# Patient Record
Sex: Female | Born: 1990 | Race: White | Hispanic: No | Marital: Single | Smoking: Never smoker
Health system: Southern US, Community
[De-identification: ages and names within clinical notes are randomized; demographics above are authoritative.]

## PROBLEM LIST (undated history)

## (undated) DIAGNOSIS — J45909 Unspecified asthma, uncomplicated: Secondary | ICD-10-CM

## (undated) HISTORY — PX: FRACTURE SURGERY: SHX138

## (undated) HISTORY — PX: OTHER SURGICAL HISTORY: SHX169

---
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
## 2015-06-08 ENCOUNTER — Ambulatory Visit: Payer: Self-pay | Admitting: Family Medicine

## 2015-06-15 HISTORY — PX: HIP SURGERY: SHX245

## 2015-06-16 DIAGNOSIS — M25551 Pain in right hip: Secondary | ICD-10-CM | POA: Insufficient documentation

## 2015-08-21 ENCOUNTER — Ambulatory Visit (INDEPENDENT_AMBULATORY_CARE_PROVIDER_SITE_OTHER): Payer: BLUE CROSS/BLUE SHIELD | Admitting: Family Medicine

## 2015-08-21 ENCOUNTER — Encounter: Payer: Self-pay | Admitting: Family Medicine

## 2015-08-21 VITALS — BP 100/58 | HR 113 | Temp 98.3°F | Resp 20 | Ht 64.0 in | Wt 151.0 lb

## 2015-08-21 DIAGNOSIS — J040 Acute laryngitis: Secondary | ICD-10-CM | POA: Diagnosis not present

## 2015-08-21 DIAGNOSIS — J019 Acute sinusitis, unspecified: Secondary | ICD-10-CM | POA: Diagnosis not present

## 2015-08-21 MED ORDER — PROMETHAZINE-DM 6.25-15 MG/5ML PO SYRP: 5.0000 mL | ORAL_SOLUTION | Freq: Four times a day (QID) | ORAL | Status: DC | PRN

## 2015-08-21 MED ORDER — AZITHROMYCIN 250 MG PO TABS: ORAL_TABLET | ORAL | Status: DC

## 2015-08-21 NOTE — Progress Notes (Signed)
Patient ID: Lauren Weber, female   DOB: [birth year], 24 y.o.   MRN: [id number]        Patient: Lauren Weber Female    DOB: 01-17-1991   24 y.o.   MRN: [id number] Visit Date: 08/21/2015  Today's Provider: Dortha Kern, PA   Chief Complaint  Patient presents with  . URI   Subjective:    URI  This is a new problem. The current episode started in the past 7 days. The problem has been gradually worsening. There has been no fever. Associated symptoms include congestion, coughing, ear pain, headaches, rhinorrhea, sinus pain, a sore throat and wheezing. She has tried decongestant for the symptoms. The treatment provided no relief.  Patient reports she has been using her Albuterol inhaler 2 puffs once a day for the last 2 days.  Patient Active Problem List   Diagnosis Date Noted  . Arthralgia of hip 06/16/2015   Past Surgical History  Procedure Laterality Date  . Hip surgery Left 06/15/2015    [facility]    Family History  Problem Relation Age of Onset  . Healthy Mother   . Healthy Father   . Healthy Sister   . Healthy Sister   . Healthy Sister    Allergies  Allergen Reactions  . Amoxicillin Rash   Previous Medications   ALBUTEROL (PROAIR HFA) 108 (90 BASE) MCG/ACT INHALER    Inhale 2 puffs into the lungs as needed.    Review of Systems  Constitutional: Negative.   HENT: Positive for congestion, ear pain, rhinorrhea and sore throat.   Respiratory: Positive for cough and wheezing.   Neurological: Positive for headaches.    Social History  Substance Use Topics  . Smoking status: Never Smoker   . Smokeless tobacco: Never Used  . Alcohol Use: No   Objective:   BP 100/58 mmHg  Pulse 113  Temp(Src) 98.3 F (36.8 C)  Resp 20  Ht  (1.626 m)  Wt 151 lb (68.493 kg)  BMI 25.91 kg/m2  SpO2 98%  LMP 08/07/2015 (Approximate)  Physical Exam  Constitutional: She is oriented to person, place, and time. She appears well-developed and well-nourished.  HENT:    Head: Normocephalic.  Right Ear: External ear normal.  Left Ear: External ear normal.  Injected and reddened posterior pharynx without exudates.  Eyes: Conjunctivae and EOM are normal.  Neck: Neck supple.  Cardiovascular: Normal rate and regular rhythm.   Pulmonary/Chest: Effort normal and breath sounds normal.  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  Neurological: She is alert and oriented to person, place, and time.  Psychiatric: She has a normal mood and affect. Her behavior is normal.      Assessment & Plan:     1. Subacute sinusitis Onset with chills, body aches, sinus pressure headache and flare of asthma. May use Albuterol inhaler prn and add cough syrup with antibiotic. Recheck in 1 week if no better. - azithromycin (ZITHROMAX) 250 MG tablet; Take two tablets by mouth the first day then one daily for 4 days.  Dispense: 6 tablet; Refill: 0  2. Laryngitis Onset yesterday with post nasal drip. Gargle with saltwater and use cough syrup prn. Recheck prn. - promethazine-dextromethorphan (PROMETHAZINE-DM) 6.25-15 MG/5ML syrup; Take 5 mLs by mouth 4 (four) times daily as needed for cough.  Dispense: 118 mL; Refill: 0  3. Asthma History of asthma as a child. Rarely needs albuterol now. Has a nebulizer  at home for treatments prn. May use Albuterol MDI up to QID prn.      Dortha Kernennis Chrismon, PA  [facility]

## 2015-08-21 NOTE — Patient Instructions (Signed)
Laryngitis  Laryngitis is inflammation of your vocal cords. This causes hoarseness, coughing, loss of voice, sore throat, or a dry throat. Your vocal cords are two bands of muscles that are found in your throat. When you speak, these cords come together and vibrate. These vibrations come out through your mouth as sound. When your vocal cords are inflamed, your voice sounds different.  Laryngitis can be temporary (acute) or long-term (chronic). Most cases of acute laryngitis improve with time. Chronic laryngitis is laryngitis that lasts for more than three weeks.  CAUSES  Acute laryngitis may be caused by:   A viral infection.   Lots of talking, yelling, or singing. This is also called vocal strain.   Bacterial infections.  Chronic laryngitis may be caused by:   Vocal strain.   Injury to your vocal cords.   Acid reflux (gastroesophageal reflux disease or GERD).   Allergies.   Sinus infection.   Smoking.   Alcohol abuse.   Breathing in chemicals or dust.   Growths on the vocal cords.  RISK FACTORS  Risk factors for laryngitis include:   Smoking.   Alcohol abuse.   Having allergies.  SIGNS AND SYMPTOMS  Symptoms of laryngitis may include:   Low, hoarse voice.   Loss of voice.   Dry cough.   Sore throat.   Stuffy nose.  DIAGNOSIS  Laryngitis may be diagnosed by:   Physical exam.   Throat culture.   Blood test.   Laryngoscopy. This procedure allows your health care provider to look at your vocal cords with a mirror or viewing tube.  TREATMENT  Treatment for laryngitis depends on what is causing it. Usually, treatment involves resting your voice and using medicines to soothe your throat. However, if your laryngitis is caused by a bacterial infection, you may need to take antibiotic medicine. If your laryngitis is caused by a growth, you may need to have a procedure to remove it.  HOME CARE INSTRUCTIONS   Drink enough fluid to keep your urine clear or pale yellow.   Breathe in moist air. Use a  humidifier if you live in a dry climate.   Take medicines only as directed by your health care provider.   If you were prescribed an antibiotic medicine, finish it all even if you start to feel better.   Do not smoke cigarettes or electronic cigarettes. If you need help quitting, ask your health care provider.   Talk as little as possible. Also avoid whispering, which can cause vocal strain.   Write instead of talking. Do this until your voice is back to normal.  SEEK MEDICAL CARE IF:   You have a fever.   You have increasing pain.   You have difficulty swallowing.  SEEK IMMEDIATE MEDICAL CARE IF:   You cough up blood.   You have trouble breathing.     This information is not intended to replace advice given to you by your health care provider. Make sure you discuss any questions you have with your health care provider.     Document Released: 08/19/2005 Document Revised: 09/09/2014 Document Reviewed: 02/01/2014  Elsevier Interactive Patient Education 2016 Elsevier Inc[employer]

## 2015-09-14 ENCOUNTER — Ambulatory Visit
Admission: RE | Admit: 2015-09-14 | Discharge: 2015-09-14 | Disposition: A | Discharge status: Home / Self Care | Payer: BLUE CROSS/BLUE SHIELD | Source: Ambulatory Visit | Attending: Family Medicine | Admitting: Family Medicine

## 2015-09-14 ENCOUNTER — Ambulatory Visit (INDEPENDENT_AMBULATORY_CARE_PROVIDER_SITE_OTHER): Payer: BLUE CROSS/BLUE SHIELD | Admitting: Family Medicine

## 2015-09-14 ENCOUNTER — Encounter: Payer: Self-pay | Admitting: Family Medicine

## 2015-09-14 VITALS — BP 102/76 | HR 94 | Temp 98.0°F | Resp 18 | Wt 149.0 lb

## 2015-09-14 DIAGNOSIS — R6883 Chills (without fever): Secondary | ICD-10-CM

## 2015-09-14 DIAGNOSIS — Z8709 Personal history of other diseases of the respiratory system: Secondary | ICD-10-CM

## 2015-09-14 DIAGNOSIS — R059 Cough, unspecified: Secondary | ICD-10-CM

## 2015-09-14 DIAGNOSIS — R05 Cough: Secondary | ICD-10-CM | POA: Diagnosis not present

## 2015-09-14 DIAGNOSIS — H109 Unspecified conjunctivitis: Secondary | ICD-10-CM | POA: Diagnosis not present

## 2015-09-14 LAB — POCT URINE PREGNANCY: Preg Test, Ur: NEGATIVE

## 2015-09-14 MED ORDER — HYDROCODONE-HOMATROPINE 5-1.5 MG/5ML PO SYRP: 5.0000 mL | ORAL_SOLUTION | Freq: Three times a day (TID) | ORAL | Status: DC | PRN

## 2015-09-14 MED ORDER — DOXYCYCLINE HYCLATE 100 MG PO TABS: 100.0000 mg | ORAL_TABLET | Freq: Two times a day (BID) | ORAL | Status: DC

## 2015-09-14 MED ORDER — ERYTHROMYCIN 5 MG/GM OP OINT: 1.0000 "application " | TOPICAL_OINTMENT | Freq: Three times a day (TID) | OPHTHALMIC | Status: DC

## 2015-09-14 MED ORDER — ALBUTEROL SULFATE HFA 108 (90 BASE) MCG/ACT IN AERS
2.0000 | INHALATION_SPRAY | RESPIRATORY_TRACT | Status: DC | PRN
Start: 2015-09-14 — End: 2017-09-04

## 2015-09-14 NOTE — Progress Notes (Signed)
Patient ID: [name], female   DOB: 1990-11-01, 25 y.o.   MRN: [id number]   Patient: Lauren Weber Female    DOB: November 05, 1990   25 y.o.   MRN: [id number] Visit Date: 09/14/2015  Today's Provider: Dortha Kern, PA   Chief Complaint  Patient presents with  . URI   Subjective:    URI  This is a new problem. The current episode started in the past 7 days. The problem has been unchanged. Associated symptoms include congestion and coughing. Associated symptoms comments: Chills, sweats, body aches, fever, and fatigue. Treatments tried: flu and cold. The treatment provided no relief.  Was treated for sinusitis on 08-21-15 with Azithromycin and Promethazine-DM. Was better until a week ago when chills, cough, fatigue and temperature elevation to 99.3. LMP 3.5 weeks ago. Still using Albuterol prn asthma.  Patient Active Problem List   Diagnosis Date Noted  . Arthralgia of hip 06/16/2015   Past Surgical History  Procedure Laterality Date  . Hip surgery Left 06/15/2015    [facility]    Family History  Problem Relation Age of Onset  . Healthy Mother   . Healthy Father   . Healthy Sister   . Healthy Sister   . Healthy Sister    Allergies  Allergen Reactions  . Amoxicillin Rash    Previous Medications   ALBUTEROL (PROAIR HFA) 108 (90 BASE) MCG/ACT INHALER    Inhale 2 puffs into the lungs as needed.   NAPROXEN (NAPROSYN) 500 MG TABLET    Take 500 mg by mouth 2 (two) times daily with a meal.   PROMETHAZINE-DEXTROMETHORPHAN (PROMETHAZINE-DM) 6.25-15 MG/5ML SYRUP    Take 5 mLs by mouth 4 (four) times daily as needed for cough.    Review of Systems  Constitutional: Positive for fever, chills and fatigue.       Sweats   HENT: Positive for congestion.   Eyes: Negative.   Respiratory: Positive for cough.   Cardiovascular: Negative.   Gastrointestinal: Negative.   Endocrine: Negative.   Genitourinary: Negative.   Musculoskeletal: Negative.   Skin: Negative.     Allergic/Immunologic: Negative.   Neurological: Negative.   Hematological: Negative.   Psychiatric/Behavioral: Negative.     Social History  Substance Use Topics  . Smoking status: Never Smoker   . Smokeless tobacco: Never Used  . Alcohol Use: No   Objective:   BP 102/76 mmHg  Pulse 94  Temp(Src) 98 F (36.7 C) (Oral)  Resp 18  Wt 149 lb (67.586 kg)  SpO2 96%  LMP 07/12/2015  Physical Exam  Constitutional: She is oriented to person, place, and time. She appears well-developed and well-nourished.  HENT:  Head: Normocephalic.  Right Ear: External ear normal.  Left Ear: External ear normal.  Cobblestone appearance with redness right posterior pharynx.  Eyes: EOM are normal.  Bloody red right palpebral and bulbar conjunctivitis.   Neck: Normal range of motion. Neck supple.  Cardiovascular: Normal rate, regular rhythm and normal heart sounds.   Pulmonary/Chest: Effort normal and breath sounds normal.  Abdominal: Soft. Bowel sounds are normal.  Lymphadenopathy:    She has no cervical adenopathy.  Neurological: She is alert and oriented to person, place, and time.  Psychiatric: She has a normal mood and affect. Her behavior is normal.      Assessment & Plan:     1. Chills Onset with cough over the past week. Was treated for subacute sinusitis on 08-21-15 and got better until this week. Influenza test was  negative for A&B. Will check CBC to rule out anemia and judge degree of infection. May use Tylenol or Advil prn and encouraged to increase fluid intake. - CBC with Differential/Platelet  2. Cough Onset over the past week with fever. Urine pregnancy test negative. Will get CXR and given antibiotic with cough syrup. Recheck pending reports - DG Chest 2 View - HYDROcodone-homatropine (HYCODAN) 5-1.5 MG/5ML syrup; Take 5 mLs by mouth every 8 (eight) hours as needed for cough.  Dispense: 120 mL; Refill: 0 - doxycycline (VIBRA-TABS) 100 MG tablet; Take 1 tablet (100 mg total)  by mouth 2 (two) times daily.  Dispense: 20 tablet; Refill: 0  3. Hx of extrinsic asthma Albuterol helps to control wheezing and decrease cough a bit. Refilled ProAir-HFA. - albuterol (PROAIR HFA) 108 (90 Base) MCG/ACT inhaler; Inhale 2 puffs into the lungs as needed.  Dispense: 1 Inhaler; Refill: 3  4. Conjunctivitis of right eye Onset the past couple days with some matting of the right eye in the mornings. Treat with antibiotic ointment and recheck if no better in 3-4 days. - erythromycin ophthalmic ointment; Place 1 application into the right eye 3 (three) times daily. 1/2 inch ribbon of ointment inside of right lower eyelid  Dispense: 3.5 g; Refill: 0

## 2015-09-14 NOTE — Addendum Note (Signed)
Addended by: Sherre PootWALSTON, Fumio Vandam N on: 09/14/2015 01:24 PM   Modules accepted: Orders

## 2015-09-14 NOTE — Patient Instructions (Signed)

## 2015-09-15 ENCOUNTER — Telehealth: Payer: Self-pay | Admitting: Family Medicine

## 2015-09-15 ENCOUNTER — Telehealth: Payer: Self-pay

## 2015-09-15 LAB — CBC WITH DIFFERENTIAL/PLATELET
Basophils Absolute: 0 10*3/uL (ref 0.0–0.2)
Basos: 0 %
EOS (ABSOLUTE): 0 10*3/uL (ref 0.0–0.4)
Eos: 0 %
Hematocrit: 42.7 % (ref 34.0–46.6)
Hemoglobin: 14.4 g/dL (ref 11.1–15.9)
Immature Grans (Abs): 0 10*3/uL (ref 0.0–0.1)
Immature Granulocytes: 0 %
Lymphocytes Absolute: 2.2 10*3/uL (ref 0.7–3.1)
Lymphs: 24 %
MCH: 29.1 pg (ref 26.6–33.0)
MCHC: 33.7 g/dL (ref 31.5–35.7)
MCV: 86 fL (ref 79–97)
Monocytes Absolute: 1.2 10*3/uL — ABNORMAL HIGH (ref 0.1–0.9)
Monocytes: 13 %
Neutrophils Absolute: 5.6 10*3/uL (ref 1.4–7.0)
Neutrophils: 63 %
Platelets: 286 10*3/uL (ref 150–379)
RBC: 4.94 x10E6/uL (ref 3.77–5.28)
RDW: 12.1 % — ABNORMAL LOW (ref 12.3–15.4)
WBC: 9 10*3/uL (ref 3.4–10.8)

## 2015-09-15 NOTE — Telephone Encounter (Signed)
Patient advised as directed below. Patient verbalized understanding.  

## 2015-09-15 NOTE — Telephone Encounter (Signed)
-----   Message from Tamsen Roersennis E Chrismon, [place] sent at 09/15/2015  4:19 PM EST ----- Normal chest x-ray without signs of pneumonia. No sign of anemia or bacterial infection on blood counts. Slight viral shift on WBC differential. Continue present medications and be sure to drink plenty of fluids. May use Advil or Tylenol prn headache or fever. Recheck in a week if not better.

## 2015-09-15 NOTE — Telephone Encounter (Signed)
Pt's mom is inquiring about her chest xr.  [phone] or [phone]  Thanks, Barth Kirksteri

## 2015-09-15 NOTE — Telephone Encounter (Signed)
Patient advised of lab results

## 2015-09-15 NOTE — Telephone Encounter (Signed)
Pt is requesting results from labs.  QM#[phone]/MWCB#[phone]/MW

## 2016-01-01 DIAGNOSIS — Z8739 Personal history of other diseases of the musculoskeletal system and connective tissue: Secondary | ICD-10-CM | POA: Diagnosis not present

## 2016-01-01 DIAGNOSIS — Z09 Encounter for follow-up examination after completed treatment for conditions other than malignant neoplasm: Secondary | ICD-10-CM | POA: Diagnosis not present

## 2016-02-01 DIAGNOSIS — M25552 Pain in left hip: Secondary | ICD-10-CM | POA: Diagnosis not present

## 2016-02-12 DIAGNOSIS — M25552 Pain in left hip: Secondary | ICD-10-CM | POA: Diagnosis not present

## 2016-02-27 DIAGNOSIS — M25552 Pain in left hip: Secondary | ICD-10-CM | POA: Diagnosis not present

## 2016-03-26 DIAGNOSIS — M25552 Pain in left hip: Secondary | ICD-10-CM | POA: Diagnosis not present

## 2016-05-02 DIAGNOSIS — M25552 Pain in left hip: Secondary | ICD-10-CM | POA: Diagnosis not present

## 2016-05-02 DIAGNOSIS — M1612 Unilateral primary osteoarthritis, left hip: Secondary | ICD-10-CM | POA: Diagnosis not present

## 2016-05-14 DIAGNOSIS — M1612 Unilateral primary osteoarthritis, left hip: Secondary | ICD-10-CM | POA: Diagnosis not present

## 2016-05-17 DIAGNOSIS — M1612 Unilateral primary osteoarthritis, left hip: Secondary | ICD-10-CM | POA: Diagnosis not present

## 2016-05-20 DIAGNOSIS — M1612 Unilateral primary osteoarthritis, left hip: Secondary | ICD-10-CM | POA: Diagnosis not present

## 2016-05-23 DIAGNOSIS — M1612 Unilateral primary osteoarthritis, left hip: Secondary | ICD-10-CM | POA: Diagnosis not present

## 2016-05-28 DIAGNOSIS — M1612 Unilateral primary osteoarthritis, left hip: Secondary | ICD-10-CM | POA: Diagnosis not present

## 2016-05-28 DIAGNOSIS — Z01818 Encounter for other preprocedural examination: Secondary | ICD-10-CM | POA: Diagnosis not present

## 2016-06-05 DIAGNOSIS — M1612 Unilateral primary osteoarthritis, left hip: Secondary | ICD-10-CM | POA: Diagnosis not present

## 2016-06-07 DIAGNOSIS — M1612 Unilateral primary osteoarthritis, left hip: Secondary | ICD-10-CM | POA: Diagnosis not present

## 2016-06-10 DIAGNOSIS — M1612 Unilateral primary osteoarthritis, left hip: Secondary | ICD-10-CM | POA: Diagnosis not present

## 2016-06-13 DIAGNOSIS — M1612 Unilateral primary osteoarthritis, left hip: Secondary | ICD-10-CM | POA: Diagnosis not present

## 2016-06-17 DIAGNOSIS — Z7951 Long term (current) use of inhaled steroids: Secondary | ICD-10-CM | POA: Diagnosis not present

## 2016-06-17 DIAGNOSIS — Z79891 Long term (current) use of opiate analgesic: Secondary | ICD-10-CM | POA: Diagnosis not present

## 2016-06-17 DIAGNOSIS — Z4789 Encounter for other orthopedic aftercare: Secondary | ICD-10-CM | POA: Diagnosis not present

## 2016-06-17 DIAGNOSIS — Z7982 Long term (current) use of aspirin: Secondary | ICD-10-CM | POA: Diagnosis not present

## 2016-06-17 DIAGNOSIS — G8918 Other acute postprocedural pain: Secondary | ICD-10-CM | POA: Diagnosis not present

## 2016-06-17 DIAGNOSIS — M1612 Unilateral primary osteoarthritis, left hip: Secondary | ICD-10-CM | POA: Diagnosis not present

## 2016-06-17 DIAGNOSIS — J45909 Unspecified asthma, uncomplicated: Secondary | ICD-10-CM | POA: Diagnosis not present

## 2016-06-17 DIAGNOSIS — M25552 Pain in left hip: Secondary | ICD-10-CM | POA: Diagnosis not present

## 2016-06-17 DIAGNOSIS — R112 Nausea with vomiting, unspecified: Secondary | ICD-10-CM | POA: Diagnosis not present

## 2016-06-17 DIAGNOSIS — M1990 Unspecified osteoarthritis, unspecified site: Secondary | ICD-10-CM | POA: Insufficient documentation

## 2016-06-17 DIAGNOSIS — Z79899 Other long term (current) drug therapy: Secondary | ICD-10-CM | POA: Diagnosis not present

## 2016-06-17 DIAGNOSIS — Z881 Allergy status to other antibiotic agents status: Secondary | ICD-10-CM | POA: Diagnosis not present

## 2016-06-21 DIAGNOSIS — Z96642 Presence of left artificial hip joint: Secondary | ICD-10-CM | POA: Diagnosis not present

## 2016-06-21 DIAGNOSIS — M25552 Pain in left hip: Secondary | ICD-10-CM | POA: Diagnosis not present

## 2016-06-23 DIAGNOSIS — G8918 Other acute postprocedural pain: Secondary | ICD-10-CM | POA: Diagnosis not present

## 2016-06-23 DIAGNOSIS — Z96642 Presence of left artificial hip joint: Secondary | ICD-10-CM | POA: Diagnosis not present

## 2016-06-23 DIAGNOSIS — M25552 Pain in left hip: Secondary | ICD-10-CM | POA: Diagnosis not present

## 2016-06-25 DIAGNOSIS — Z96642 Presence of left artificial hip joint: Secondary | ICD-10-CM | POA: Diagnosis not present

## 2016-06-25 DIAGNOSIS — M25552 Pain in left hip: Secondary | ICD-10-CM | POA: Diagnosis not present

## 2016-06-27 DIAGNOSIS — M25552 Pain in left hip: Secondary | ICD-10-CM | POA: Diagnosis not present

## 2016-06-27 DIAGNOSIS — Z96642 Presence of left artificial hip joint: Secondary | ICD-10-CM | POA: Diagnosis not present

## 2016-07-02 DIAGNOSIS — Z96642 Presence of left artificial hip joint: Secondary | ICD-10-CM | POA: Diagnosis not present

## 2016-07-02 DIAGNOSIS — M25552 Pain in left hip: Secondary | ICD-10-CM | POA: Diagnosis not present

## 2016-07-04 DIAGNOSIS — M25552 Pain in left hip: Secondary | ICD-10-CM | POA: Diagnosis not present

## 2016-07-04 DIAGNOSIS — Z96642 Presence of left artificial hip joint: Secondary | ICD-10-CM | POA: Diagnosis not present

## 2016-07-09 DIAGNOSIS — M25552 Pain in left hip: Secondary | ICD-10-CM | POA: Diagnosis not present

## 2016-07-09 DIAGNOSIS — Z96642 Presence of left artificial hip joint: Secondary | ICD-10-CM | POA: Diagnosis not present

## 2016-07-11 DIAGNOSIS — Z96642 Presence of left artificial hip joint: Secondary | ICD-10-CM | POA: Diagnosis not present

## 2016-07-11 DIAGNOSIS — M25552 Pain in left hip: Secondary | ICD-10-CM | POA: Diagnosis not present

## 2016-07-16 DIAGNOSIS — M25552 Pain in left hip: Secondary | ICD-10-CM | POA: Diagnosis not present

## 2016-07-16 DIAGNOSIS — Z96642 Presence of left artificial hip joint: Secondary | ICD-10-CM | POA: Diagnosis not present

## 2016-07-18 DIAGNOSIS — Z96642 Presence of left artificial hip joint: Secondary | ICD-10-CM | POA: Diagnosis not present

## 2016-07-18 DIAGNOSIS — M25552 Pain in left hip: Secondary | ICD-10-CM | POA: Diagnosis not present

## 2016-07-22 DIAGNOSIS — Z96642 Presence of left artificial hip joint: Secondary | ICD-10-CM | POA: Diagnosis not present

## 2016-07-22 DIAGNOSIS — M25552 Pain in left hip: Secondary | ICD-10-CM | POA: Diagnosis not present

## 2016-07-24 DIAGNOSIS — Z96642 Presence of left artificial hip joint: Secondary | ICD-10-CM | POA: Diagnosis not present

## 2016-07-24 DIAGNOSIS — M25552 Pain in left hip: Secondary | ICD-10-CM | POA: Diagnosis not present

## 2016-07-30 DIAGNOSIS — M25552 Pain in left hip: Secondary | ICD-10-CM | POA: Diagnosis not present

## 2016-07-30 DIAGNOSIS — Z96642 Presence of left artificial hip joint: Secondary | ICD-10-CM | POA: Diagnosis not present

## 2016-08-01 DIAGNOSIS — M25552 Pain in left hip: Secondary | ICD-10-CM | POA: Diagnosis not present

## 2016-08-01 DIAGNOSIS — Z96642 Presence of left artificial hip joint: Secondary | ICD-10-CM | POA: Diagnosis not present

## 2016-08-08 DIAGNOSIS — Z471 Aftercare following joint replacement surgery: Secondary | ICD-10-CM | POA: Diagnosis not present

## 2016-08-08 DIAGNOSIS — Z96642 Presence of left artificial hip joint: Secondary | ICD-10-CM | POA: Diagnosis not present

## 2016-08-13 DIAGNOSIS — M25552 Pain in left hip: Secondary | ICD-10-CM | POA: Diagnosis not present

## 2016-08-13 DIAGNOSIS — Z96642 Presence of left artificial hip joint: Secondary | ICD-10-CM | POA: Diagnosis not present

## 2016-08-15 DIAGNOSIS — Z96642 Presence of left artificial hip joint: Secondary | ICD-10-CM | POA: Diagnosis not present

## 2016-08-15 DIAGNOSIS — M25552 Pain in left hip: Secondary | ICD-10-CM | POA: Diagnosis not present

## 2016-08-20 DIAGNOSIS — Z96642 Presence of left artificial hip joint: Secondary | ICD-10-CM | POA: Diagnosis not present

## 2016-08-20 DIAGNOSIS — M25552 Pain in left hip: Secondary | ICD-10-CM | POA: Diagnosis not present

## 2016-08-22 DIAGNOSIS — M25552 Pain in left hip: Secondary | ICD-10-CM | POA: Diagnosis not present

## 2016-08-22 DIAGNOSIS — Z96642 Presence of left artificial hip joint: Secondary | ICD-10-CM | POA: Diagnosis not present

## 2016-10-02 DIAGNOSIS — Z96642 Presence of left artificial hip joint: Secondary | ICD-10-CM | POA: Diagnosis not present

## 2016-10-02 DIAGNOSIS — Z471 Aftercare following joint replacement surgery: Secondary | ICD-10-CM | POA: Diagnosis not present

## 2016-12-23 DIAGNOSIS — H52223 Regular astigmatism, bilateral: Secondary | ICD-10-CM | POA: Diagnosis not present

## 2017-09-04 ENCOUNTER — Other Ambulatory Visit: Payer: Self-pay | Admitting: Family Medicine

## 2017-09-04 DIAGNOSIS — Z8709 Personal history of other diseases of the respiratory system: Secondary | ICD-10-CM

## 2017-09-04 MED ORDER — DM-GUAIFENESIN ER 30-600 MG PO TB12: 1.0000 | ORAL_TABLET | Freq: Two times a day (BID) | ORAL | 0 refills | Status: DC

## 2017-09-04 MED ORDER — ALBUTEROL SULFATE HFA 108 (90 BASE) MCG/ACT IN AERS: 2.0000 | INHALATION_SPRAY | RESPIRATORY_TRACT | 3 refills | Status: DC | PRN

## 2017-09-04 NOTE — Progress Notes (Signed)
Some flare of asthma with persistent dry cough over the past week. Requests refill of albuterol inhaler and Tessalon Perles for cough. [place]in [place] in the next 6-7 days. Advised she [place]

## 2017-09-29 DIAGNOSIS — J069 Acute upper respiratory infection, unspecified: Secondary | ICD-10-CM | POA: Diagnosis not present

## 2017-11-10 ENCOUNTER — Encounter: Payer: Self-pay | Admitting: Family Medicine

## 2017-11-10 ENCOUNTER — Telehealth: Payer: Self-pay | Admitting: Family Medicine

## 2017-11-10 ENCOUNTER — Ambulatory Visit (INDEPENDENT_AMBULATORY_CARE_PROVIDER_SITE_OTHER): Payer: BLUE CROSS/BLUE SHIELD | Admitting: Family Medicine

## 2017-11-10 VITALS — BP 92/58 | HR 72 | Temp 98.3°F | Resp 16 | Ht 61.0 in | Wt 151.0 lb

## 2017-11-10 DIAGNOSIS — Z0000 Encounter for general adult medical examination without abnormal findings: Secondary | ICD-10-CM | POA: Diagnosis not present

## 2017-11-10 DIAGNOSIS — Z124 Encounter for screening for malignant neoplasm of cervix: Secondary | ICD-10-CM | POA: Diagnosis not present

## 2017-11-10 DIAGNOSIS — Z114 Encounter for screening for human immunodeficiency virus [HIV]: Secondary | ICD-10-CM

## 2017-11-10 DIAGNOSIS — R5381 Other malaise: Secondary | ICD-10-CM

## 2017-11-10 DIAGNOSIS — R5383 Other fatigue: Secondary | ICD-10-CM

## 2017-11-10 LAB — POCT URINALYSIS DIPSTICK
Bilirubin, UA: NEGATIVE
Blood, UA: NEGATIVE
Glucose, UA: NEGATIVE
Ketones, UA: NEGATIVE
Leukocytes, UA: NEGATIVE
Nitrite, UA: NEGATIVE
Protein, UA: NEGATIVE
Spec Grav, UA: 1.01 (ref 1.010–1.025)
Urobilinogen, UA: 0.2 E.U./dL
pH, UA: 7 (ref 5.0–8.0)

## 2017-11-10 NOTE — Telephone Encounter (Signed)
[facility] [address]. Check with pharmacy to see if refill still available. Note written.

## 2017-11-10 NOTE — Telephone Encounter (Signed)
Called pharmacy [facility] [address]) and they are no longer in business. Per voicemail, all customers are [facility] to [facility]. Called the patient's grandmother to verify new pharmacy, and she would like a hard copy instead so the patient's mother could have it filled.   Also, she was wanting to know if the patient's note could say that she will be able to return to back to work by Thursday (3/14)? She reports that the patient felt really weak and tired after her blood draw this morning and being that her BP is so low, she feels that the patient will be back to herself by Thursday. She reports that if she feels better before then she will go back to work.

## 2017-11-10 NOTE — Telephone Encounter (Signed)
Patient needs note saying she was seen today for "low blood" and that she is not feeling well.  She also wants a pres. For Albuteral.  Will pick up this afternoon around 4:00 pm.

## 2017-11-10 NOTE — Progress Notes (Signed)
Patient: Lauren Weber, Female    DOB: 04/05/1991, 27 y.o.   MRN: [id number] Visit Date: 11/10/2017  Today's Provider: Dortha Kernennis Chrismon, PA   Chief Complaint  Patient presents with  . Annual Exam  . low BP   Subjective:    Annual physical exam Lauren Weber is a 27 y.o. female who presents today for health maintenance and complete physical. She feels well. She reports exercising daily. She reports she is sleeping fairly well.   Patient also mentions that she has had low BP recently. She has been dieting doing a keto diet for the last month. Patient reports that she has increased her salt intake, and reports that she has not improved. She also has occasional headaches that lasts at least 1 hour. She takes Excedrin.   Review of Systems  Constitutional: Negative.   HENT: Negative.   Eyes: Negative.   Respiratory: Negative.   Cardiovascular: Negative for chest pain, palpitations and leg swelling.  Gastrointestinal: Negative.   Endocrine: Negative.   Genitourinary: Negative.   Musculoskeletal: Positive for arthralgias.  Skin: Negative.   Allergic/Immunologic: Negative.   Neurological: Positive for dizziness and headaches.  Hematological: Negative.   Psychiatric/Behavioral: Positive for decreased concentration.    Social History      She  reports that  has never smoked. she has never used smokeless tobacco. She reports that she does not drink alcohol or use drugs.       Social History   Socioeconomic History  . Marital status: Single    Spouse name: None  . Number of children: None  . Years of education: None  . Highest education level: None  Social Needs  . Financial resource strain: None  . Food insecurity - worry: None  . Food insecurity - inability: None  . Transportation needs - medical: None  . Transportation needs - non-medical: None  Occupational History  . None  Tobacco Use  . Smoking status: Never Smoker  . Smokeless tobacco: Never Used    Substance and Sexual Activity  . Alcohol use: No  . Drug use: No  . Sexual activity: None  Other Topics Concern  . None  Social History Narrative  . None    No past medical history on file.   Patient Active Problem List   Diagnosis Date Noted  . Arthralgia of hip 06/16/2015    Past Surgical History:  Procedure Laterality Date  . [facility]     Family History        Family Status  Relation Name Status  . Mother  Alive  . Father  Alive  . Sister  Alive  . Sister  Alive  . Sister  Alive        Her family history includes Healthy in her father, mother, sister, sister, and sister.     Allergies  Allergen Reactions  . Amoxicillin Rash    Current Outpatient Medications:  .  albuterol (PROAIR HFA) 108 (90 Base) MCG/ACT inhaler, Inhale 2 puffs into the lungs as needed., Disp: 1 Inhaler, Rfl: 3 .  dextromethorphan-guaiFENesin (MUCINEX DM) 30-600 MG 12hr tablet, Take 1 tablet by mouth 2 (two) times daily. (Patient not taking: Reported on 11/10/2017), Disp: 20 tablet, Rfl: 0 .  doxycycline (VIBRA-TABS) 100 MG tablet, Take 1 tablet (100 mg total) by mouth 2 (two) times daily. (Patient not taking: Reported on 11/10/2017), Disp: 20 tablet, Rfl: 0 .  erythromycin ophthalmic ointment, Place  1 application into the right eye 3 (three) times daily. 1/2 inch ribbon of ointment inside of right lower eyelid (Patient not taking: Reported on 11/10/2017), Disp: 3.5 g, Rfl: 0 .  HYDROcodone-homatropine (HYCODAN) 5-1.5 MG/5ML syrup, Take 5 mLs by mouth every 8 (eight) hours as needed for cough. (Patient not taking: Reported on 11/10/2017), Disp: 120 mL, Rfl: 0 .  naproxen (NAPROSYN) 500 MG tablet, Take 500 mg by mouth 2 (two) times daily with a meal., Disp: , Rfl:  .  promethazine-dextromethorphan (PROMETHAZINE-DM) 6.25-15 MG/5ML syrup, Take 5 mLs by mouth 4 (four) times daily as needed for cough. (Patient not taking: Reported on 11/10/2017), Disp: 118 mL, Rfl: 0    Patient Care Team: Chrismon, Jodell Cipro, PA as PCP - General (Family Medicine)      Objective:   Vitals: BP (!) 92/58 (BP Location: Right Arm, Patient Position: Sitting, Cuff Size: Normal)   Pulse 72   Temp 98.3 F (36.8 C)   Resp 16   Ht 5\' 1"  (1.549 m)   Wt 151 lb (68.5 kg) Comment: per patient  LMP 10/20/2017   BMI 28.53 kg/m    Vitals:   11/10/17 0905  BP: (!) 92/58  Pulse: 72  Resp: 16  Temp: 98.3 F (36.8 C)  Weight: 151 lb (68.5 kg)  Height: 5\' 1"  (1.549 m)     Physical Exam  Constitutional: She is oriented to person, place, and time. She appears well-developed and well-nourished.  HENT:  Head: Normocephalic and atraumatic.  Right Ear: External ear normal.  Left Ear: External ear normal.  Nose: Nose normal.  Mouth/Throat: Oropharynx is clear and moist.  Eyes: Conjunctivae and EOM are normal. Pupils are equal, round, and reactive to light. Right eye exhibits no discharge.  Neck: Normal range of motion. Neck supple. No tracheal deviation present. No thyromegaly present.  Cardiovascular: Normal rate, regular rhythm, normal heart sounds and intact distal pulses.  No murmur heard. Pulmonary/Chest: Effort normal and breath sounds normal. No respiratory distress. She has no wheezes. She has no rales. She exhibits no tenderness.  Abdominal: Soft. She exhibits no distension and no mass. There is no tenderness. There is no rebound and no guarding.  Genitourinary: Vagina normal. Rectal exam shows guaiac negative stool. No vaginal discharge found.  Genitourinary Comments: Small uterus - no history of sexual activity.  Musculoskeletal: Normal range of motion. She exhibits no edema or tenderness.  Lymphadenopathy:    She has no cervical adenopathy.  Neurological: She is alert and oriented to person, place, and time. She has normal reflexes. No cranial nerve deficit. She exhibits normal muscle tone. Coordination normal.  Skin: Skin is warm and dry. No rash noted. No  erythema.  Psychiatric: She has a normal mood and affect. Her behavior is normal. Judgment and thought content normal.    Depression Screen PHQ 2/9 Scores 11/10/2017  PHQ - 2 Score 2  PHQ- 9 Score 5    Assessment & Plan:     Routine Health Maintenance and Physical Exam  Exercise Activities and Dietary recommendations Goals    Recommend more balanced diet with increase in water intake and exercise 3-4 days a week for 30 minutes each time.      Health Maintenance  Topic Date Due  . HIV Screening  01/06/2008  . TETANUS/TDAP  01/06/2012  . PAP SMEAR  01/05/2014  . INFLUENZA VACCINE  12/01/2017 (Originally 04/02/2017)    Discussed health benefits of physical activity, and encouraged her to engage in regular exercise  appropriate for her age and condition.    1. Annual physical exam General health normal. Hemoccult slide negative during DRE. Unable to give tetanus booster today due to vaccine not available. Check routine labs and exercise 3-4 days a week with increase in fluid intake. - Pap IG, CT/NG NAA, and HPV (high risk) - CBC with Differential/Platelet - Comprehensive metabolic panel - TSH - Lipid panel  2. Cervical cancer screening LMP 10-20-17 without significant or prolonged menses. Some dysmenorrhea symptoms with cramps. Normal pelvic and breast exam. Not sexually active and difficult to get PAP. - Pap IG, CT/NG NAA, and HPV (high risk)  3. Malaise and fatigue Generalized fatigue, headache and malaise intermittently the past month after trying a Ketogenic Diet. Has stopped the diet but not a lot better yet. Increase fluid intake and get back on balanced diet with regular exercise. Check labs for signs of anemia, dehydration or other metabolic disorders. - CBC with Differential/Platelet - Comprehensive metabolic panel - TSH - POCT urinalysis dipstick  4. Screening for HIV (human immunodeficiency virus) - HIV antibody   Dortha Kern, PA  [facility]

## 2017-11-10 NOTE — Telephone Encounter (Signed)
Done

## 2017-11-11 LAB — CBC WITH DIFFERENTIAL/PLATELET
Basophils Absolute: 0.1 10*3/uL (ref 0.0–0.2)
Basos: 1 %
EOS (ABSOLUTE): 0.4 10*3/uL (ref 0.0–0.4)
Eos: 5 %
Hematocrit: 41.2 % (ref 34.0–46.6)
Hemoglobin: 13.3 g/dL (ref 11.1–15.9)
Immature Grans (Abs): 0 10*3/uL (ref 0.0–0.1)
Immature Granulocytes: 0 %
Lymphocytes Absolute: 2.9 10*3/uL (ref 0.7–3.1)
Lymphs: 35 %
MCH: 29.6 pg (ref 26.6–33.0)
MCHC: 32.3 g/dL (ref 31.5–35.7)
MCV: 92 fL (ref 79–97)
Monocytes Absolute: 0.6 10*3/uL (ref 0.1–0.9)
Monocytes: 7 %
Neutrophils Absolute: 4.3 10*3/uL (ref 1.4–7.0)
Neutrophils: 52 %
Platelets: 323 10*3/uL (ref 150–379)
RBC: 4.5 x10E6/uL (ref 3.77–5.28)
RDW: 12.8 % (ref 12.3–15.4)
WBC: 8.2 10*3/uL (ref 3.4–10.8)

## 2017-11-11 LAB — LIPID PANEL
Chol/HDL Ratio: 2.3 ratio (ref 0.0–4.4)
Cholesterol, Total: 177 mg/dL (ref 100–199)
HDL: 76 mg/dL (ref >39)
LDL Calculated: 92 mg/dL (ref 0–99)
Triglycerides: 43 mg/dL (ref 0–149)
VLDL Cholesterol Cal: 9 mg/dL (ref 5–40)

## 2017-11-11 LAB — COMPREHENSIVE METABOLIC PANEL
ALT: 17 IU/L (ref 0–32)
AST: 16 IU/L (ref 0–40)
Albumin/Globulin Ratio: 2 (ref 1.2–2.2)
Albumin: 4.4 g/dL (ref 3.5–5.5)
Alkaline Phosphatase: 63 IU/L (ref 39–117)
BUN/Creatinine Ratio: 19 (ref 9–23)
BUN: 14 mg/dL (ref 6–20)
Bilirubin Total: 0.4 mg/dL (ref 0.0–1.2)
CO2: 23 mmol/L (ref 20–29)
Calcium: 9 mg/dL (ref 8.7–10.2)
Chloride: 103 mmol/L (ref 96–106)
Creatinine, Ser: 0.72 mg/dL (ref 0.57–1.00)
GFR calc Af Amer: 136 mL/min/{1.73_m2} (ref >59)
GFR calc non Af Amer: 118 mL/min/{1.73_m2} (ref >59)
Globulin, Total: 2.2 g/dL (ref 1.5–4.5)
Glucose: 80 mg/dL (ref 65–99)
Potassium: 4.7 mmol/L (ref 3.5–5.2)
Sodium: 139 mmol/L (ref 134–144)
Total Protein: 6.6 g/dL (ref 6.0–8.5)

## 2017-11-11 LAB — HIV ANTIBODY (ROUTINE TESTING W REFLEX): HIV Screen 4th Generation wRfx: NONREACTIVE

## 2017-11-11 LAB — TSH: TSH: 2.66 u[IU]/mL (ref 0.450–4.500)

## 2017-11-13 LAB — PAP IG, CT-NG NAA, HPV HIGH-RISK
Chlamydia, Nuc. Acid Amp: NEGATIVE
Gonococcus by Nucleic Acid Amp: NEGATIVE
HPV, high-risk: NEGATIVE
PAP Smear Comment: 0

## 2017-11-14 DIAGNOSIS — R002 Palpitations: Secondary | ICD-10-CM | POA: Diagnosis not present

## 2017-11-14 DIAGNOSIS — R42 Dizziness and giddiness: Secondary | ICD-10-CM | POA: Diagnosis not present

## 2017-11-14 DIAGNOSIS — Z3202 Encounter for pregnancy test, result negative: Secondary | ICD-10-CM | POA: Diagnosis not present

## 2017-11-14 DIAGNOSIS — R5383 Other fatigue: Secondary | ICD-10-CM | POA: Diagnosis not present

## 2017-11-14 DIAGNOSIS — R51 Headache: Secondary | ICD-10-CM | POA: Diagnosis not present

## 2017-11-17 DIAGNOSIS — R079 Chest pain, unspecified: Secondary | ICD-10-CM | POA: Diagnosis not present

## 2017-11-17 DIAGNOSIS — G4452 New daily persistent headache (NDPH): Secondary | ICD-10-CM | POA: Diagnosis not present

## 2017-11-17 DIAGNOSIS — R42 Dizziness and giddiness: Secondary | ICD-10-CM | POA: Diagnosis not present

## 2017-11-17 DIAGNOSIS — J45909 Unspecified asthma, uncomplicated: Secondary | ICD-10-CM | POA: Diagnosis not present

## 2017-11-17 DIAGNOSIS — R51 Headache: Secondary | ICD-10-CM | POA: Diagnosis not present

## 2017-11-17 DIAGNOSIS — Z96642 Presence of left artificial hip joint: Secondary | ICD-10-CM | POA: Diagnosis not present

## 2017-12-01 DIAGNOSIS — R0609 Other forms of dyspnea: Secondary | ICD-10-CM | POA: Diagnosis not present

## 2017-12-01 DIAGNOSIS — R002 Palpitations: Secondary | ICD-10-CM | POA: Diagnosis not present

## 2017-12-01 DIAGNOSIS — R0789 Other chest pain: Secondary | ICD-10-CM | POA: Diagnosis not present

## 2017-12-01 DIAGNOSIS — R42 Dizziness and giddiness: Secondary | ICD-10-CM | POA: Diagnosis not present

## 2017-12-16 DIAGNOSIS — R002 Palpitations: Secondary | ICD-10-CM | POA: Diagnosis not present

## 2018-01-01 DIAGNOSIS — R002 Palpitations: Secondary | ICD-10-CM | POA: Diagnosis not present

## 2018-01-01 DIAGNOSIS — R42 Dizziness and giddiness: Secondary | ICD-10-CM | POA: Diagnosis not present

## 2018-04-08 ENCOUNTER — Ambulatory Visit (INDEPENDENT_AMBULATORY_CARE_PROVIDER_SITE_OTHER): Payer: BLUE CROSS/BLUE SHIELD | Admitting: Family Medicine

## 2018-04-08 ENCOUNTER — Encounter: Payer: Self-pay | Admitting: Family Medicine

## 2018-04-08 VITALS — BP 111/77 | HR 79 | Temp 98.7°F | Resp 16 | Wt 154.0 lb

## 2018-04-08 DIAGNOSIS — F988 Other specified behavioral and emotional disorders with onset usually occurring in childhood and adolescence: Secondary | ICD-10-CM | POA: Diagnosis not present

## 2018-04-08 DIAGNOSIS — F419 Anxiety disorder, unspecified: Secondary | ICD-10-CM | POA: Diagnosis not present

## 2018-04-08 MED ORDER — AMPHETAMINE-DEXTROAMPHETAMINE 5 MG PO TABS: 5.0000 mg | ORAL_TABLET | Freq: Two times a day (BID) | ORAL | 0 refills | Status: DC

## 2018-04-08 NOTE — Progress Notes (Signed)
Patient: Lauren Weber L Merkel Female    DOB: 09/04/1990   27 y.o.   MRN: [id number] Visit Date: 04/08/2018  Today's Provider: Mila Merryonald Fisher, MD   Chief Complaint  Patient presents with  . Anxiety   Subjective:    Anxiety  Presents for follow-up visit. Symptoms include decreased concentration, depressed mood, excessive worry, insomnia, irritability, nervous/anxious behavior, panic and restlessness. Patient reports no chest pain, compulsions, confusion, dizziness, dry mouth, feeling of choking, hyperventilation, impotence, malaise, muscle tension, nausea, obsessions, palpitations, shortness of breath or suicidal ideas. Symptoms occur occasionally (at least 2 days a week). Duration: mintues to hours. The severity of symptoms is moderate. The quality of sleep is good. Nighttime awakenings: one to two.     Patient is here to discuss Anxiety and trouble with attention and focus. Patient states she has episodes at least 2 days a week. Anxiety mostly seems to happen at work. Patient states during these episodes she will have symptoms of decreased concentration, depressed mood, excessive worry, nervousness, panic, and irritability. She feels like her anxiety is provoked by frustration with focusing and attention at work. She has been followed by Dr. Omelia BlackwaterHeaden in the past and reports she was prescribed Vyvanse at one time in high school which she didn't like taking because it made her feel weird. 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 She states she doesn't have any trouble with anxiety on her days off.    Allergies  Allergen Reactions  . Amoxicillin Rash     Current Outpatient Medications:  .  albuterol (PROAIR HFA) 108 (90 Base) MCG/ACT inhaler, Inhale 2 puffs into the lungs as needed., Disp: 1 Inhaler, Rfl: 3 .  naproxen (NAPROSYN) 500 MG tablet, Take 500 mg by mouth 2 (two) times daily with a meal., Disp: , Rfl:   Review of  Systems  Constitutional: Positive for irritability. Negative for appetite change, chills, fatigue and fever.  Respiratory: Negative for chest tightness and shortness of breath.   Cardiovascular: Negative for chest pain and palpitations.  Gastrointestinal: Negative for abdominal pain, nausea and vomiting.  Genitourinary: Negative for impotence.  Neurological: Negative for dizziness and weakness.  Psychiatric/Behavioral: Positive for agitation, decreased concentration and sleep disturbance. Negative for confusion and suicidal ideas. The patient is nervous/anxious and has insomnia.     Social History   Tobacco Use  . Smoking status: Never Smoker  . Smokeless tobacco: Never Used  Substance Use Topics  . Alcohol use: No   Objective:   BP 111/77 (BP Location: Left Arm, Patient Position: Sitting, Cuff Size: Normal)   Pulse 79   Temp 98.7 F (37.1 C) (Oral)   Resp 16   Wt 154 lb (69.9 kg)   SpO2 98%   BMI 29.10 kg/m     Physical Exam  General appearance: alert, well developed, well nourished, cooperative and in no distress Head: Normocephalic, without obvious abnormality, atraumatic Respiratory: Respirations even and unlabored, normal respiratory rate Extremities: No gross deformities Skin: Skin color, texture, turgor normal. No rashes seen  Psych: Appropriate mood and affect. Neurologic: Mental status: Alert, oriented to person, place, and time, thought content appropriate.     Assessment & Plan:     1. Anxiety Likely secondary to frustration with ADD with her new job.   2. Attention deficit disorder (ADD) without hyperactivity Previously treated by Dr. Omelia BlackwaterHeaden who no longer accepts her insurance, but did not do well  with Vyvanse prescribed in the past. Will try- amphetamine-dextroamphetamine (ADDERALL) 5 MG tablet; Take 1 tablet (5 mg total) by mouth 2 (two) times daily with a meal.  Dispense: 60 tablet; Refill: 0  Advised to start with just one in the morning, and may  increase to BID if she feels it is wearing off before end of work day.   Follow up in a month.        Mila Merry, MD  [facility]

## 2018-04-28 NOTE — Progress Notes (Signed)
Patient: Lauren Weber Female    DOB: [birth year]   27 y.o.   MRN: [id number] Visit Date: 04/29/2018  Today's Provider: Mila Merry, MD   Chief Complaint  Patient presents with  . Shoulder Pain    x 1 week   Subjective:    Shoulder Pain   The pain is present in the left shoulder. Episode onset: 1 week ago. There has been a history of trauma (injured shoulder about 4 years ago). The problem occurs intermittently. The problem has been gradually worsening. Associated symptoms include numbness (in left arm) and tingling. Pertinent negatives include no fever or stiffness. Exacerbated by: movement of shoulder joint.  She has been taking 2 ibuprofen every four hours with minimal relief. 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   Follow up ADD Was prescribed low dose of Adderall on 04-08-2018 and reports that has been very effective. She usually takes one at the beginning of her work shift, but doesn't take the second dose very often. Is well tolerated and not having any side effects that she is aware of. Able to focus and concentrated on her job much better and not getting distracted as easily.      Allergies  Allergen Reactions  . Amoxicillin Rash     Current Outpatient Medications:  .  albuterol (PROAIR HFA) 108 (90 Base) MCG/ACT inhaler, Inhale 2 puffs into the lungs as needed., Disp: 1 Inhaler, Rfl: 3 .  amphetamine-dextroamphetamine (ADDERALL) 5 MG tablet, Take 1 tablet (5 mg total) by mouth 2 (two) times daily with a meal., Disp: 60 tablet, Rfl: 0 .  naproxen (NAPROSYN) 500 MG tablet, Take 500 mg by mouth 2 (two) times daily with a meal., Disp: , Rfl:   Review of Systems  Constitutional: Negative for appetite change, chills, fatigue and fever.  Respiratory: Negative for chest tightness and shortness of breath.   Cardiovascular: Negative for chest pain and palpitations.  Gastrointestinal: Negative for  abdominal pain, nausea and vomiting.  Musculoskeletal: Positive for arthralgias (left shoulder). Negative for stiffness.  Neurological: Positive for tingling and numbness (in left arm). Negative for dizziness and weakness.    Social History   Tobacco Use  . Smoking status: Never Smoker  . Smokeless tobacco: Never Used  Substance Use Topics  . Alcohol use: No   Objective:   BP 100/65 (BP Location: Left Arm, Patient Position: Sitting, Cuff Size: Normal)   Pulse 75   Temp 98.5 F (36.9 C) (Oral)   Wt 156 lb (70.8 kg)   SpO2 99% Comment: room air  BMI 29.48 kg/m  Vitals:   04/29/18 0811  BP: 100/65  Pulse: 75  Temp: 98.5 F (36.9 C)  TempSrc: Oral  SpO2: 99%  Weight: 156 lb (70.8 kg)     Physical Exam  General appearance: alert, well developed, well nourished, cooperative and in no distress Head: Normocephalic, without obvious abnormality, atraumatic Respiratory: Respirations even and unlabored, normal respiratory rate Extremities: Tender over left anterior glenohumeral joint with mild swelling. FROM, pain at limits of external rotation.      Assessment & Plan:     1. Acute pain of left shoulder Progressive over last 10 days on ibuprofen. start- predniSONE (DELTASONE) 20 MG tablet; Take 1 tablet (20 mg total) by mouth 2 (two) times daily with a meal.  Dispense: 20 tablet; Refill: 0.  Call for orthopedic referral if not greatly improved next  week.   2. ADD Doing well since initiation of low dose Adderall. Continue current medications.        Mila Merryonald Fisher, MD  [facility]

## 2018-04-29 ENCOUNTER — Ambulatory Visit (INDEPENDENT_AMBULATORY_CARE_PROVIDER_SITE_OTHER): Payer: BLUE CROSS/BLUE SHIELD | Admitting: Family Medicine

## 2018-04-29 ENCOUNTER — Encounter: Payer: Self-pay | Admitting: Family Medicine

## 2018-04-29 VITALS — BP 100/65 | HR 75 | Temp 98.5°F | Wt 156.0 lb

## 2018-04-29 DIAGNOSIS — M25512 Pain in left shoulder: Secondary | ICD-10-CM

## 2018-04-29 DIAGNOSIS — F988 Other specified behavioral and emotional disorders with onset usually occurring in childhood and adolescence: Secondary | ICD-10-CM | POA: Diagnosis not present

## 2018-04-29 MED ORDER — PREDNISONE 20 MG PO TABS: 20.0000 mg | ORAL_TABLET | Freq: Two times a day (BID) | ORAL | 0 refills | Status: DC

## 2018-05-11 ENCOUNTER — Ambulatory Visit: Payer: Self-pay | Admitting: Family Medicine

## 2018-05-22 ENCOUNTER — Ambulatory Visit (INDEPENDENT_AMBULATORY_CARE_PROVIDER_SITE_OTHER): Payer: BLUE CROSS/BLUE SHIELD | Admitting: Family Medicine

## 2018-05-22 ENCOUNTER — Encounter: Payer: Self-pay | Admitting: Family Medicine

## 2018-05-22 VITALS — BP 98/68 | HR 85 | Temp 98.6°F | Wt 152.0 lb

## 2018-05-22 DIAGNOSIS — F988 Other specified behavioral and emotional disorders with onset usually occurring in childhood and adolescence: Secondary | ICD-10-CM | POA: Diagnosis not present

## 2018-05-22 MED ORDER — AMPHETAMINE-DEXTROAMPHETAMINE 5 MG PO TABS: 5.0000 mg | ORAL_TABLET | Freq: Two times a day (BID) | ORAL | 0 refills | Status: DC

## 2018-05-22 NOTE — Progress Notes (Signed)
Patient: Lauren Weber Female    DOB: 07/07/1991   27 y.o.   MRN: [id number] Visit Date: 05/22/2018  Today's Provider: Dortha Kern, PA   Chief Complaint  Patient presents with  . ADHD    One month follow up    Subjective:    HPI  Pt comes in today for a follow-up of ADHD.  She reports starting Adderall 5mg  in the morning and occasionally in the PM for work.      No past medical history on file. Patient Active Problem List   Diagnosis Date Noted  . 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 Arthralgia of hip 06/16/2015   Past Surgical History:  Procedure Laterality Date  . HIP SURGERY Left 06/15/2015   [facility]    Family History  Problem Relation Age of Onset  . Healthy Mother   . Healthy Father   . Healthy Sister   . Healthy Sister   . Healthy Sister    Allergies  Allergen Reactions  . Amoxicillin Rash    Current Outpatient Medications:  .  albuterol (PROAIR HFA) 108 (90 Base) MCG/ACT inhaler, Inhale 2 puffs into the lungs as needed., Disp: 1 Inhaler, Rfl: 3 .  amphetamine-dextroamphetamine (ADDERALL) 5 MG tablet, Take 1 tablet (5 mg total) by mouth 2 (two) times daily with a meal., Disp: 60 tablet, Rfl: 0 .  naproxen (NAPROSYN) 500 MG tablet, Take 500 mg by mouth 2 (two) times daily with a meal., Disp: , Rfl:  .  predniSONE (DELTASONE) 20 MG tablet, Take 1 tablet (20 mg total) by mouth 2 (two) times daily with a meal. (Patient not taking: Reported on 05/22/2018), Disp: 20 tablet, Rfl: 0  Review of Systems  Constitutional: Negative.   Gastrointestinal: Negative.   Psychiatric/Behavioral: Negative for agitation, behavioral problems, confusion, decreased concentration, dysphoric mood, hallucinations, self-injury, sleep disturbance and suicidal ideas. The patient is not nervous/anxious and is not hyperactive.    Social History   Tobacco Use  . Smoking status: Never Smoker  . Smokeless tobacco: Never Used  Substance Use  Topics  . Alcohol use: No   Objective:   BP 98/68 (BP Location: Right Arm, Patient Position: Sitting, Cuff Size: Normal)   Pulse 85   Temp 98.6 F (37 C) (Oral)   Wt 152 lb (68.9 kg)   LMP 05/01/2018   SpO2 97%   BMI 28.72 kg/m   Wt Readings from Last 3 Encounters:  05/22/18 152 lb (68.9 kg)  04/29/18 156 lb (70.8 kg)  04/08/18 154 lb (69.9 kg)    Vitals:   05/22/18 1438  BP: 98/68  Pulse: 85  Temp: 98.6 F (37 C)  TempSrc: Oral  SpO2: 97%  Weight: 152 lb (68.9 kg)   Physical Exam  Constitutional: She is oriented to person, place, and time. She appears well-developed and well-nourished. No distress.  HENT:  Head: Normocephalic and atraumatic.  Right Ear: Hearing normal.  Left Ear: Hearing normal.  Nose: Nose normal.  Eyes: Conjunctivae and lids are normal. Right eye exhibits no discharge. Left eye exhibits no discharge. No scleral icterus.  Cardiovascular: Normal rate and regular rhythm.  Pulmonary/Chest: Effort normal and breath sounds normal. No respiratory distress.  Musculoskeletal: Normal range of motion.  Neurological: She is alert and oriented to person, place, and time.  Skin: Skin is intact. No lesion and no rash noted.  Psychiatric: She has a normal mood and affect. Her speech is normal and behavior is  normal. Thought content normal.      Assessment & Plan:     1. Attention deficit disorder (ADD) without hyperactivity Able to focus and concentrate better. Anxiety symptoms calmer. No significant weight loss, palpitations, chest pains, tremor, appetite loss or sleep disturbance. Feels the Adderall has help a great deal. Uses one tablet on a regular basis daily. Occasionally will add the second tablet. Will refill medications and recheck in 3 months. - amphetamine-dextroamphetamine (ADDERALL) 5 MG tablet; Take 1 tablet (5 mg total) by mouth 2 (two) times daily with a meal.  Dispense: 60 tablet; Refill: 0       Dortha Kernennis Chrismon, PA  [facility]

## 2018-06-19 DIAGNOSIS — F3289 Other specified depressive episodes: Secondary | ICD-10-CM | POA: Diagnosis not present

## 2018-06-19 DIAGNOSIS — F4310 Post-traumatic stress disorder, unspecified: Secondary | ICD-10-CM | POA: Diagnosis not present

## 2018-06-23 ENCOUNTER — Encounter: Payer: Self-pay | Admitting: Family Medicine

## 2018-06-23 ENCOUNTER — Ambulatory Visit (INDEPENDENT_AMBULATORY_CARE_PROVIDER_SITE_OTHER): Payer: BLUE CROSS/BLUE SHIELD | Admitting: Family Medicine

## 2018-06-23 VITALS — BP 98/60 | HR 70 | Temp 97.6°F | Resp 16 | Wt 157.0 lb

## 2018-06-23 DIAGNOSIS — Z30017 Encounter for initial prescription of implantable subdermal contraceptive: Secondary | ICD-10-CM

## 2018-06-23 DIAGNOSIS — F4310 Post-traumatic stress disorder, unspecified: Secondary | ICD-10-CM | POA: Diagnosis not present

## 2018-06-23 DIAGNOSIS — F3289 Other specified depressive episodes: Secondary | ICD-10-CM | POA: Diagnosis not present

## 2018-06-23 LAB — POCT URINE PREGNANCY: Preg Test, Ur: NEGATIVE

## 2018-06-23 MED ORDER — ETONOGESTREL 68 MG ~~LOC~~ IMPL
68.0000 mg | DRUG_IMPLANT | Freq: Once | SUBCUTANEOUS | Status: AC
  Administered 2018-06-23: 68 mg via SUBCUTANEOUS

## 2018-06-23 NOTE — Progress Notes (Signed)
Name: Lauren Weber   MRN: [id number]    DOB: [birth year]   Date:06/23/2018       Progress Note  Subjective  Chief Complaint  Chief Complaint  Patient presents with  . Pre-op Exam    HPI  Patient present today to discuss contraceptive options.  She has never taken contraception before, except for a sample of OCPs.  She found it difficult to remember to take the pills daily.  She has friends that have Nexplanons and she likes the idea of the ease of LARC.  She has irregular menses that are initially heavy and then lighter.  She is not sexually active and has no h/o STDs   History reviewed. No pertinent past medical history.  Social History   Tobacco Use  . Smoking status: Never Smoker  . Smokeless tobacco: Never Used  Substance Use Topics  . Alcohol use: No     Current Outpatient Medications:  .  albuterol (PROAIR HFA) 108 (90 Base) MCG/ACT inhaler, Inhale 2 puffs into the lungs as needed., Disp: 1 Inhaler, Rfl: 3 .  amphetamine-dextroamphetamine (ADDERALL) 5 MG tablet, Take 1 tablet (5 mg total) by mouth 2 (two) times daily with a meal., Disp: 60 tablet, Rfl: 0 .  naproxen (NAPROSYN) 500 MG tablet, Take 500 mg by mouth 2 (two) times daily with a meal., Disp: , Rfl:  .  predniSONE (DELTASONE) 20 MG tablet, Take 1 tablet (20 mg total) by mouth 2 (two) times daily with a meal. (Patient not taking: Reported on 05/22/2018), Disp: 20 tablet, Rfl: 0  Current Facility-Administered Medications:  .  etonogestrel (NEXPLANON) implant 68 mg, 68 mg, Subdermal, Once, Krizia Flight, Marzella Schlein, MD  Allergies  Allergen Reactions  . Amoxicillin Rash    Review of Systems  Constitutional: Negative.   Respiratory: Negative.   Cardiovascular: Negative.   Genitourinary: Negative.   Neurological: Negative.   Psychiatric/Behavioral: Negative.     Objective  Vitals:   06/23/18 0815  BP: 98/60  Pulse: 70  Resp: 16  Temp: 97.6 F (36.4 C)  TempSrc: Oral  SpO2: 99%  Weight: 157 lb (71.2  kg)     Physical Exam  Constitutional: She is oriented to person, place, and time. She appears well-developed and well-nourished. No distress.  HENT:  Head: Normocephalic and atraumatic.  Eyes: Conjunctivae are normal. No scleral icterus.  Cardiovascular: Normal rate and regular rhythm.  Pulmonary/Chest: Effort normal. No respiratory distress.  Musculoskeletal: She exhibits no edema.  Neurological: She is alert and oriented to person, place, and time.  Skin: Skin is warm and dry. Capillary refill takes less than 2 seconds. No rash noted.  Psychiatric: She has a normal mood and affect. Her behavior is normal.  Vitals reviewed.    Recent Results (from the past 2160 hour(s))  POCT urine pregnancy     Status: None   Collection Time: 06/23/18  8:27 AM  Result Value Ref Range   Preg Test, Ur Negative Negative     Assessment & Plan 27 y.o. F G0 with h/o ADD presents for contraception and decides on Nexplanon placement  1. Nexplanon insertion - discussed contraceptive options and pros and cons of each - patient decides to proceed with Nexplanon insertion - Pregnancy test negative - discussed risks and benefits of proedure and procedure performed as below with no complications - POCT urine pregnancy - etonogestrel (NEXPLANON) implant 68 mg   PROCEDURE NOTE: NEXPLANON INSERTION Patient given informed consent and signed copy in the chart. An appropriate time  out was been taken Pregnancy test negative Left  arm area prepped and draped in the usual sterile fashion. 4 cc of 1% lidocaine without epinephrine used for local anesthesia.   Nexplanon was inserted in typical fashion in the  Left ARM. There were no complications.  Pressure bandage was  applied to decrease bruising. Patient given follow up instructions should she experience redness, swelling at sight or fever in the next 24 hours. Patient given Nexplanon pocket card.   Return if symptoms worsen or fail to improve.   The  entirety of the information documented in the History of Present Illness, Review of Systems and Physical Exam were personally obtained by me. Portions of this information were initially documented by Hetty Ely, CMA and reviewed by me for thoroughness and accuracy.    Erasmo Downer, MD, MPH [facility] 06/23/2018 8:34 AM

## 2018-06-23 NOTE — Patient Instructions (Signed)
Nexplanon Instructions After Insertion  Keep bandage clean and dry for 24 hours  May use ice/Tylenol/Ibuprofen for soreness or pain  If you develop fever, drainage or increased warmth from incision site-contact office immediately   

## 2018-07-07 DIAGNOSIS — F3289 Other specified depressive episodes: Secondary | ICD-10-CM | POA: Diagnosis not present

## 2018-07-07 DIAGNOSIS — F4310 Post-traumatic stress disorder, unspecified: Secondary | ICD-10-CM | POA: Diagnosis not present

## 2018-07-09 DIAGNOSIS — Z880 Allergy status to penicillin: Secondary | ICD-10-CM | POA: Diagnosis not present

## 2018-07-09 DIAGNOSIS — N951 Menopausal and female climacteric states: Secondary | ICD-10-CM | POA: Diagnosis not present

## 2018-07-09 DIAGNOSIS — M25551 Pain in right hip: Secondary | ICD-10-CM | POA: Diagnosis not present

## 2018-07-21 DIAGNOSIS — S73191A Other sprain of right hip, initial encounter: Secondary | ICD-10-CM | POA: Diagnosis not present

## 2018-07-21 DIAGNOSIS — M67851 Other specified disorders of synovium, right hip: Secondary | ICD-10-CM | POA: Diagnosis not present

## 2018-07-25 DIAGNOSIS — G8929 Other chronic pain: Secondary | ICD-10-CM | POA: Diagnosis not present

## 2018-07-25 DIAGNOSIS — S73191A Other sprain of right hip, initial encounter: Secondary | ICD-10-CM | POA: Diagnosis not present

## 2018-07-25 DIAGNOSIS — M25551 Pain in right hip: Secondary | ICD-10-CM | POA: Diagnosis not present

## 2018-08-03 DIAGNOSIS — M25551 Pain in right hip: Secondary | ICD-10-CM | POA: Diagnosis not present

## 2018-08-13 ENCOUNTER — Ambulatory Visit (INDEPENDENT_AMBULATORY_CARE_PROVIDER_SITE_OTHER): Payer: BLUE CROSS/BLUE SHIELD | Admitting: Family Medicine

## 2018-08-13 ENCOUNTER — Encounter: Payer: Self-pay | Admitting: Family Medicine

## 2018-08-13 VITALS — BP 104/64 | HR 79 | Temp 98.4°F | Resp 16 | Wt 161.0 lb

## 2018-08-13 DIAGNOSIS — Z01818 Encounter for other preprocedural examination: Secondary | ICD-10-CM | POA: Diagnosis not present

## 2018-08-13 DIAGNOSIS — M25551 Pain in right hip: Secondary | ICD-10-CM | POA: Diagnosis not present

## 2018-08-13 DIAGNOSIS — Z96642 Presence of left artificial hip joint: Secondary | ICD-10-CM

## 2018-08-13 NOTE — Progress Notes (Signed)
Patient: Lauren Weber Female    DOB: 1990/12/07   27 y.o.   MRN: [id number] Visit Date: 08/13/2018  Today's Provider: Dortha Kern, PA   Chief Complaint  Patient presents with  . Pre-op Exam   Subjective:     HPI  Patient is having hip orthoscopy on 09/17/2017. Patient is here for medical clearance.   History reviewed. No pertinent past medical history. Patient Active Problem List   Diagnosis Date Noted  . 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 Arthralgia of hip 06/16/2015   Past Surgical History:  Procedure Laterality Date  . HIP SURGERY Left 06/15/2015   [facility] [place]    Family History  Problem Relation Age of Onset  . Healthy Mother   . Healthy Father   . Healthy Sister   . Healthy Sister   . Healthy Sister    Allergies  Allergen Reactions  . Amoxicillin Rash    Current Outpatient Medications:  .  albuterol (PROAIR HFA) 108 (90 Base) MCG/ACT inhaler, Inhale 2 puffs into the lungs as needed., Disp: 1 Inhaler, Rfl: 3 .  amphetamine-dextroamphetamine (ADDERALL) 5 MG tablet, Take 1 tablet (5 mg total) by mouth 2 (two) times daily with a meal., Disp: 60 tablet, Rfl: 0 .  naproxen (NAPROSYN) 500 MG tablet, Take 500 mg by mouth 2 (two) times daily with a meal., Disp: , Rfl:  .  predniSONE (DELTASONE) 20 MG tablet, Take 1 tablet (20 mg total) by mouth 2 (two) times daily with a meal. (Patient not taking: Reported on 08/13/2018), Disp: 20 tablet, Rfl: 0  Review of Systems  Constitutional: Negative for appetite change, chills, fatigue and fever.  Respiratory: Negative for chest tightness and shortness of breath.   Cardiovascular: Negative for chest pain and palpitations.  Gastrointestinal: Negative for abdominal pain, nausea and vomiting.  Neurological: Negative for dizziness and weakness.   Social History   Tobacco Use  . Smoking status: Never Smoker  . Smokeless tobacco: Never Used  Substance Use Topics  . Alcohol use:  No   Objective:   BP 104/64 (BP Location: Right Arm, Patient Position: Sitting, Cuff Size: Normal)   Pulse 79   Temp 98.4 F (36.9 C) (Oral)   Resp 16   Wt 161 lb (73 kg)   SpO2 99%   BMI 30.42 kg/m  Vitals:   08/13/18 1023  BP: 104/64  Pulse: 79  Resp: 16  Temp: 98.4 F (36.9 C)  TempSrc: Oral  SpO2: 99%  Weight: 161 lb (73 kg)   Physical Exam Constitutional:      General: She is not in acute distress.    Appearance: Normal appearance. She is well-developed.  HENT:     Head: Normocephalic and atraumatic.     Right Ear: Hearing, tympanic membrane and ear canal normal.     Left Ear: Hearing and tympanic membrane normal.     Nose: Nose normal.     Mouth/Throat:     Mouth: Mucous membranes are moist.     Pharynx: Oropharynx is clear.  Eyes:     General: Lids are normal. No scleral icterus.       Right eye: No discharge.        Left eye: No discharge.     Extraocular Movements: Extraocular movements intact.     Conjunctiva/sclera: Conjunctivae normal.  Neck:     Musculoskeletal: Neck supple.     Vascular: No carotid bruit.  Cardiovascular:  Rate and Rhythm: Normal rate and regular rhythm.     Pulses: Normal pulses.     Heart sounds: Normal heart sounds.  Pulmonary:     Effort: Pulmonary effort is normal. No respiratory distress.     Breath sounds: Normal breath sounds.  Abdominal:     General: Bowel sounds are normal.  Musculoskeletal: Normal range of motion.  Skin:    Findings: No lesion or rash.  Neurological:     Mental Status: She is alert and oriented to person, place, and time.  Psychiatric:        Speech: Speech normal.        Behavior: Behavior normal.        Thought Content: Thought content normal.       Assessment & Plan:    1. Pre-op exam Medically stable and at low risk for orthopedic surgery. Rare flare of asthma with good control with infrequent use of Albuterol Inhaler. Given note for medical clearance for surgery planned for 09-17-18  by Dr. 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  2. Right hip pain Onset over the past 4-6 weeks with labral tear and joint impingement noted on MRI of the right hip. Scheduled for arthroscopic repair 09-17-18. Has a history of total left hip replacement 06-17-16 with history of traumatic left hip dislocation in 2006 - s/p 5 hip surgeries (pins placed, pins removed, hardware plates/screws placed and arthroscopy).  3. Hx of total hip arthroplasty, left As stated above in 2017. No pain and good ROM on the left.     Dortha Kernennis Chrismon, PA  [facility]

## 2018-08-27 ENCOUNTER — Ambulatory Visit (INDEPENDENT_AMBULATORY_CARE_PROVIDER_SITE_OTHER): Payer: BLUE CROSS/BLUE SHIELD | Admitting: Family Medicine

## 2018-08-27 ENCOUNTER — Encounter: Payer: Self-pay | Admitting: Family Medicine

## 2018-08-27 VITALS — BP 106/72 | HR 80 | Temp 98.5°F | Resp 16 | Wt 161.0 lb

## 2018-08-27 DIAGNOSIS — J069 Acute upper respiratory infection, unspecified: Secondary | ICD-10-CM | POA: Diagnosis not present

## 2018-08-27 DIAGNOSIS — J45909 Unspecified asthma, uncomplicated: Secondary | ICD-10-CM

## 2018-08-27 DIAGNOSIS — R05 Cough: Secondary | ICD-10-CM

## 2018-08-27 DIAGNOSIS — R059 Cough, unspecified: Secondary | ICD-10-CM

## 2018-08-27 MED ORDER — AZITHROMYCIN 250 MG PO TABS: ORAL_TABLET | ORAL | 0 refills | Status: AC

## 2018-08-27 MED ORDER — AZITHROMYCIN 250 MG PO TABS: ORAL_TABLET | ORAL | 0 refills | Status: DC

## 2018-08-27 MED ORDER — PREDNISONE 10 MG PO TABS: ORAL_TABLET | ORAL | 0 refills | Status: DC

## 2018-08-27 NOTE — Progress Notes (Signed)
Patient: Lauren Weber Female    DOB: 03/06/1991   27 y.o.   MRN: [id number] Visit Date: 08/27/2018  Today's Provider: Mila Merryonald Kalifa Cadden, MD   Chief Complaint  Patient presents with  . URI    Started about four days ago.  . Sinusitis   Subjective:     URI   This is a new problem. The current episode started in the past 7 days. The problem has been gradually worsening. There has been no fever. Associated symptoms include congestion, coughing, rhinorrhea, sinus pain, sneezing, a sore throat and wheezing. Pertinent negatives include no abdominal pain, chest pain, diarrhea, ear pain, headaches, nausea, neck pain, plugged ear sensation, swollen glands or vomiting.  Sinusitis  This is a new problem. The current episode started in the past 7 days. The problem has been gradually worsening since onset. Associated symptoms include congestion, coughing, shortness of breath, sinus pressure, sneezing and a sore throat. Pertinent negatives include no chills, diaphoresis, ear pain, headaches, neck pain or swollen glands. The treatment provided no relief.  she does report history of asthma and has been using her albuterol inhaler a few times a day this week, last was just before her OV this morning. Did have fever 3-4 days ago, but none in the last 3 days.   Allergies  Allergen Reactions  . Amoxicillin Rash     Current Outpatient Medications:  .  albuterol (PROAIR HFA) 108 (90 Base) MCG/ACT inhaler, Inhale 2 puffs into the lungs as needed., Disp: 1 Inhaler, Rfl: 3 .  amphetamine-dextroamphetamine (ADDERALL) 5 MG tablet, Take 1 tablet (5 mg total) by mouth 2 (two) times daily with a meal., Disp: 60 tablet, Rfl: 0 .  naproxen (NAPROSYN) 500 MG tablet, Take 500 mg by mouth 2 (two) times daily with a meal., Disp: , Rfl:    Review of Systems  Constitutional: Positive for fatigue. Negative for activity change, appetite change, chills, diaphoresis, fever and unexpected weight change.  HENT:  Positive for congestion, postnasal drip, rhinorrhea, sinus pressure, sinus pain, sneezing and sore throat. Negative for ear pain, tinnitus and trouble swallowing.   Eyes: Positive for discharge. Negative for photophobia, pain, redness, itching and visual disturbance.  Respiratory: Positive for cough, chest tightness, shortness of breath and wheezing. Negative for apnea.   Cardiovascular: Negative for chest pain.  Gastrointestinal: Negative.  Negative for abdominal pain, diarrhea, nausea and vomiting.  Musculoskeletal: Negative for neck pain and neck stiffness.  Neurological: Negative for dizziness, light-headedness and headaches.    Social History   Tobacco Use  . Smoking status: Never Smoker  . Smokeless tobacco: Never Used  Substance Use Topics  . Alcohol use: No      Objective:   BP 106/72 (BP Location: Right Arm, Patient Position: Sitting, Cuff Size: Normal)   Pulse 80   Temp 98.5 F (36.9 C) (Oral)   Resp 16   Wt 161 lb (73 kg)   LMP 08/24/2018   BMI 30.42 kg/m  Vitals:   08/27/18 0851  BP: 106/72  Pulse: 80  Resp: 16  Temp: 98.5 F (36.9 C)  TempSrc: Oral  Weight: 161 lb (73 kg)     Physical Exam  General Appearance:    Alert, cooperative, no distress  HENT:   bilateral TM normal without fluid or infection, sinuses nontender and nasal mucosa congested  Eyes:    PERRL, conjunctiva/corneas clear, EOM's intact       Lungs:     Clear to auscultation  bilaterally, respirations unlabored  Heart:    Regular rate and rhythm  Neurologic:   Awake, alert, oriented x 3. No apparent focal neurological           defect.           Assessment & Plan    1. Cough  - predniSONE (DELTASONE) 10 MG tablet; 6 tablets for 1 day, then 5 for 1 day, then 4 for 1 day, then 3 for 1 day, then 2 for 1 day then 1 for 1 day.  Dispense: 21 tablet; Refill: 0  2. Upper respiratory tract infection, unspecified type  - azithromycin (ZITHROMAX) 250 MG tablet; 2 by mouth today, then 1 daily  for 4 days  Dispense: 6 tablet; Refill: 0  3. Uncomplicated asthma, unspecified asthma severity, unspecified whether persistent  - predniSONE (DELTASONE) 10 MG tablet; 6 tablets for 1 day, then 5 for 1 day, then 4 for 1 day, then 3 for 1 day, then 2 for 1 day then 1 for 1 day.  Dispense: 21 tablet; Refill: 0  Call if symptoms change or if not rapidly improving.      Mila Merryonald Taylan Mayhan, MD  [facility]

## 2018-08-27 NOTE — Patient Instructions (Signed)
.   Please bring all of your medications to every appointment so we can make sure that our medication list is the same as yours.   

## 2018-09-11 ENCOUNTER — Telehealth: Payer: Self-pay

## 2018-09-11 NOTE — Telephone Encounter (Signed)
This should be authorized by surgeon after surgery to be sure it would not adversely interact with any medications he may prescribe at that point.

## 2018-09-11 NOTE — Telephone Encounter (Signed)
Please Review

## 2018-09-11 NOTE — Telephone Encounter (Signed)
Patient reports that she will be having surgery next week and would like Dennis to call in the Trans Derm patches to help with the nausea she has after anesthesia. [facility] [place]. Please advise. Thanks!

## 2018-09-14 ENCOUNTER — Telehealth: Payer: Self-pay | Admitting: Family Medicine

## 2018-09-14 NOTE — Telephone Encounter (Signed)
Patient is returning a call to Josie °

## 2018-09-14 NOTE — Telephone Encounter (Signed)
Patient advised as directed below. 

## 2018-09-14 NOTE — Telephone Encounter (Signed)
LMTCB

## 2018-09-14 NOTE — Telephone Encounter (Signed)
Please see other telephone note

## 2018-09-16 ENCOUNTER — Telehealth: Payer: Self-pay | Admitting: Family Medicine

## 2018-09-16 DIAGNOSIS — R112 Nausea with vomiting, unspecified: Secondary | ICD-10-CM

## 2018-09-16 DIAGNOSIS — M25551 Pain in right hip: Secondary | ICD-10-CM | POA: Diagnosis not present

## 2018-09-16 DIAGNOSIS — Z9889 Other specified postprocedural states: Principal | ICD-10-CM

## 2018-09-16 MED ORDER — SCOPOLAMINE 1 MG/3DAYS TD PT72: 1.0000 | MEDICATED_PATCH | TRANSDERMAL | 0 refills | Status: DC

## 2018-09-16 NOTE — Telephone Encounter (Signed)
Patient advised.

## 2018-09-16 NOTE — Telephone Encounter (Signed)
Hello Maurine Minister is not in today and patient is having her surgery tomorrow.Patient had called and requested this on 09/11/2018 per Maurine Minister should be authorized by surgeon and patient was advised on 01/13 and now she is calling if this can be send in for her.   Please advise

## 2018-09-16 NOTE — Telephone Encounter (Signed)
Sent in scopolamine patch for her

## 2018-09-16 NOTE — Telephone Encounter (Signed)
Pt calling to let Maurine Minister know: Pt's surgery is at 9 am on Thursday morning. Surgeon Has Ordered: Xarelto  Doxycycline Pepcid Naproxen Norco Valium  Needing Dennis to prescribe Laveda Norman Scope Patch  for tomorrow's 9 am surgery  Please advise.  Thanks, [employer]

## 2018-09-17 DIAGNOSIS — S73191A Other sprain of right hip, initial encounter: Secondary | ICD-10-CM | POA: Diagnosis not present

## 2018-09-17 DIAGNOSIS — M94251 Chondromalacia, right hip: Secondary | ICD-10-CM | POA: Diagnosis not present

## 2018-09-17 DIAGNOSIS — M25551 Pain in right hip: Secondary | ICD-10-CM | POA: Diagnosis not present

## 2018-09-17 DIAGNOSIS — M25851 Other specified joint disorders, right hip: Secondary | ICD-10-CM | POA: Diagnosis not present

## 2018-09-17 DIAGNOSIS — M65851 Other synovitis and tenosynovitis, right thigh: Secondary | ICD-10-CM | POA: Diagnosis not present

## 2018-09-21 DIAGNOSIS — M25851 Other specified joint disorders, right hip: Secondary | ICD-10-CM | POA: Diagnosis not present

## 2018-09-21 DIAGNOSIS — M25551 Pain in right hip: Secondary | ICD-10-CM | POA: Diagnosis not present

## 2018-09-23 DIAGNOSIS — M25851 Other specified joint disorders, right hip: Secondary | ICD-10-CM | POA: Diagnosis not present

## 2018-09-23 DIAGNOSIS — M25551 Pain in right hip: Secondary | ICD-10-CM | POA: Diagnosis not present

## 2018-09-24 ENCOUNTER — Telehealth: Payer: Self-pay | Admitting: Family Medicine

## 2018-09-24 NOTE — Telephone Encounter (Signed)
Pt called back checking on the patches for nausea.  Please advise.  Thanks, [employer]

## 2018-09-24 NOTE — Telephone Encounter (Signed)
Pt calling to let Maurine Minister know she is still getting nauseated  from pain medication.  Asking if more patches can be called in for her to help?  Please advise.  Thanks, [employer]

## 2018-09-24 NOTE — Telephone Encounter (Signed)
She should be making the surgeon aware of this side effect issue. They may need to change pain medications.

## 2018-09-25 ENCOUNTER — Telehealth: Payer: Self-pay | Admitting: Family Medicine

## 2018-09-25 ENCOUNTER — Other Ambulatory Visit: Payer: Self-pay | Admitting: Family Medicine

## 2018-09-25 DIAGNOSIS — R112 Nausea with vomiting, unspecified: Secondary | ICD-10-CM

## 2018-09-25 DIAGNOSIS — M25551 Pain in right hip: Secondary | ICD-10-CM | POA: Diagnosis not present

## 2018-09-25 DIAGNOSIS — M25851 Other specified joint disorders, right hip: Secondary | ICD-10-CM | POA: Diagnosis not present

## 2018-09-25 DIAGNOSIS — Z9889 Other specified postprocedural states: Principal | ICD-10-CM

## 2018-09-25 MED ORDER — SCOPOLAMINE 1 MG/3DAYS TD PT72: 1.0000 | MEDICATED_PATCH | TRANSDERMAL | 0 refills | Status: DC

## 2018-09-25 NOTE — Telephone Encounter (Signed)
Per patient she called the surgeon office and was told to call her primary doctor and that's why she is calling.

## 2018-09-25 NOTE — Telephone Encounter (Signed)
Patient called back wanting to know if another scopalimine patch was sent in for her.

## 2018-09-25 NOTE — Telephone Encounter (Signed)
Please see other telephone encounter.

## 2018-09-25 NOTE — Telephone Encounter (Signed)
Will refill patches at [facility] and should schedule follow up appointment to check BP, heart rate, vision changes, dry mouth, agitation and possible withdrawal symptoms.

## 2018-09-25 NOTE — Telephone Encounter (Signed)
Patient advised as directed below. Scheduled appointment for Thursday January 30,2020 at 1:20 pm.

## 2018-09-29 DIAGNOSIS — M25551 Pain in right hip: Secondary | ICD-10-CM | POA: Diagnosis not present

## 2018-09-29 DIAGNOSIS — M25851 Other specified joint disorders, right hip: Secondary | ICD-10-CM | POA: Diagnosis not present

## 2018-10-01 ENCOUNTER — Ambulatory Visit: Payer: BLUE CROSS/BLUE SHIELD | Admitting: Family Medicine

## 2018-10-01 DIAGNOSIS — M25851 Other specified joint disorders, right hip: Secondary | ICD-10-CM | POA: Diagnosis not present

## 2018-10-01 DIAGNOSIS — M25551 Pain in right hip: Secondary | ICD-10-CM | POA: Diagnosis not present

## 2018-10-06 DIAGNOSIS — M25551 Pain in right hip: Secondary | ICD-10-CM | POA: Diagnosis not present

## 2018-10-06 DIAGNOSIS — M25851 Other specified joint disorders, right hip: Secondary | ICD-10-CM | POA: Diagnosis not present

## 2018-10-15 DIAGNOSIS — M25551 Pain in right hip: Secondary | ICD-10-CM | POA: Diagnosis not present

## 2018-10-15 DIAGNOSIS — M25851 Other specified joint disorders, right hip: Secondary | ICD-10-CM | POA: Diagnosis not present

## 2018-10-16 DIAGNOSIS — M25551 Pain in right hip: Secondary | ICD-10-CM | POA: Diagnosis not present

## 2018-10-16 DIAGNOSIS — M25851 Other specified joint disorders, right hip: Secondary | ICD-10-CM | POA: Diagnosis not present

## 2018-10-19 DIAGNOSIS — M25551 Pain in right hip: Secondary | ICD-10-CM | POA: Diagnosis not present

## 2018-10-19 DIAGNOSIS — M25851 Other specified joint disorders, right hip: Secondary | ICD-10-CM | POA: Diagnosis not present

## 2018-10-20 DIAGNOSIS — M25551 Pain in right hip: Secondary | ICD-10-CM | POA: Diagnosis not present

## 2018-10-20 DIAGNOSIS — Z9889 Other specified postprocedural states: Secondary | ICD-10-CM | POA: Diagnosis not present

## 2018-10-22 DIAGNOSIS — M25551 Pain in right hip: Secondary | ICD-10-CM | POA: Diagnosis not present

## 2018-10-22 DIAGNOSIS — M25851 Other specified joint disorders, right hip: Secondary | ICD-10-CM | POA: Diagnosis not present

## 2018-10-27 DIAGNOSIS — M25551 Pain in right hip: Secondary | ICD-10-CM | POA: Diagnosis not present

## 2018-10-27 DIAGNOSIS — M25851 Other specified joint disorders, right hip: Secondary | ICD-10-CM | POA: Diagnosis not present

## 2018-10-30 DIAGNOSIS — M25551 Pain in right hip: Secondary | ICD-10-CM | POA: Diagnosis not present

## 2018-10-30 DIAGNOSIS — M25851 Other specified joint disorders, right hip: Secondary | ICD-10-CM | POA: Diagnosis not present

## 2018-11-03 DIAGNOSIS — M25851 Other specified joint disorders, right hip: Secondary | ICD-10-CM | POA: Diagnosis not present

## 2018-11-03 DIAGNOSIS — M25551 Pain in right hip: Secondary | ICD-10-CM | POA: Diagnosis not present

## 2018-11-04 DIAGNOSIS — Z8614 Personal history of Methicillin resistant Staphylococcus aureus infection: Secondary | ICD-10-CM | POA: Diagnosis not present

## 2018-11-04 DIAGNOSIS — S73191A Other sprain of right hip, initial encounter: Secondary | ICD-10-CM | POA: Diagnosis not present

## 2018-11-04 DIAGNOSIS — Z131 Encounter for screening for diabetes mellitus: Secondary | ICD-10-CM | POA: Diagnosis not present

## 2018-11-04 DIAGNOSIS — Z13228 Encounter for screening for other metabolic disorders: Secondary | ICD-10-CM | POA: Diagnosis not present

## 2018-11-04 DIAGNOSIS — X58XXXD Exposure to other specified factors, subsequent encounter: Secondary | ICD-10-CM | POA: Diagnosis not present

## 2018-11-04 DIAGNOSIS — Z9889 Other specified postprocedural states: Secondary | ICD-10-CM | POA: Diagnosis not present

## 2018-11-04 DIAGNOSIS — Z96642 Presence of left artificial hip joint: Secondary | ICD-10-CM | POA: Diagnosis not present

## 2018-11-04 DIAGNOSIS — M1611 Unilateral primary osteoarthritis, right hip: Secondary | ICD-10-CM | POA: Insufficient documentation

## 2018-11-04 DIAGNOSIS — S73191D Other sprain of right hip, subsequent encounter: Secondary | ICD-10-CM | POA: Diagnosis not present

## 2018-11-04 DIAGNOSIS — Z130 Encounter for screening for diseases of the blood and blood-forming organs and certain disorders involving the immune mechanism: Secondary | ICD-10-CM | POA: Diagnosis not present

## 2018-11-04 DIAGNOSIS — Z880 Allergy status to penicillin: Secondary | ICD-10-CM | POA: Diagnosis not present

## 2018-11-04 DIAGNOSIS — Z01818 Encounter for other preprocedural examination: Secondary | ICD-10-CM | POA: Diagnosis not present

## 2018-11-20 ENCOUNTER — Other Ambulatory Visit: Payer: Self-pay | Admitting: Family Medicine

## 2018-11-20 DIAGNOSIS — F988 Other specified behavioral and emotional disorders with onset usually occurring in childhood and adolescence: Secondary | ICD-10-CM

## 2018-11-20 MED ORDER — AMPHETAMINE-DEXTROAMPHETAMINE 5 MG PO TABS: 5.0000 mg | ORAL_TABLET | Freq: Two times a day (BID) | ORAL | 0 refills | Status: DC

## 2018-11-20 NOTE — Telephone Encounter (Signed)
Pt needing a refill on: amphetamine-dextroamphetamine (ADDERALL) 5 MG tablet  Please fill at:  [id number] [place], [place] - [address] [phone] (Phone) [phone] (Fax)   Thanks, [employer]

## 2018-11-27 ENCOUNTER — Encounter: Payer: Self-pay | Admitting: Family Medicine

## 2018-11-27 ENCOUNTER — Ambulatory Visit (INDEPENDENT_AMBULATORY_CARE_PROVIDER_SITE_OTHER): Payer: BLUE CROSS/BLUE SHIELD | Admitting: Family Medicine

## 2018-11-27 ENCOUNTER — Other Ambulatory Visit: Payer: Self-pay

## 2018-11-27 VITALS — BP 110/72 | HR 92 | Temp 98.4°F | Resp 16 | Wt 163.2 lb

## 2018-11-27 DIAGNOSIS — Z8709 Personal history of other diseases of the respiratory system: Secondary | ICD-10-CM

## 2018-11-27 DIAGNOSIS — S46912A Strain of unspecified muscle, fascia and tendon at shoulder and upper arm level, left arm, initial encounter: Secondary | ICD-10-CM | POA: Diagnosis not present

## 2018-11-27 MED ORDER — ALBUTEROL SULFATE HFA 108 (90 BASE) MCG/ACT IN AERS: 2.0000 | INHALATION_SPRAY | RESPIRATORY_TRACT | 3 refills | Status: DC | PRN

## 2018-11-27 MED ORDER — METHOCARBAMOL 500 MG PO TABS: 500.0000 mg | ORAL_TABLET | Freq: Four times a day (QID) | ORAL | 0 refills | Status: DC | PRN

## 2018-11-27 NOTE — Progress Notes (Signed)
Patient: Lauren Weber Female    DOB: 1990-12-18   27 y.o.   MRN: [id number] Visit Date: 11/27/2018  Today's Provider: Dortha Kern, PA   Chief Complaint  Patient presents with  . Shoulder Pain   Subjective:     Shoulder Pain   The pain is present in the left shoulder (radiating to left side of neck and down back). This is a recurrent problem. The current episode started 1 to 4 weeks ago. There has been no history of extremity trauma. The problem has been gradually worsening. The quality of the pain is described as aching and sharp. Associated symptoms include an inability to bear weight, joint swelling, a limited range of motion and stiffness. Pertinent negatives include no fever, itching, joint locking, numbness or tingling. Associated symptoms comments: Patient reports that shoulder is popping . She has tried NSAIDS and acetaminophen (sling) for the symptoms. The treatment provided no relief. Family history does not include gout or rheumatoid arthritis. Her past medical history is significant for osteoarthritis. There is no history of diabetes, gout or rheumatoid arthritis.   History reviewed. No pertinent past medical history. Patient Active Problem List   Diagnosis Date Noted  . Primary osteoarthritis of right hip 11/04/2018  . Tear of right acetabular labrum 11/04/2018  . 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 DJD (degenerative joint disease) 06/17/2016  . Right hip pain 06/16/2015   Past Surgical History:  Procedure Laterality Date  . HIP SURGERY Left 06/15/2015   [facility]    Family History  Problem Relation Age of Onset  . Healthy Mother   . Healthy Father   . Healthy Sister   . Healthy Sister   . Healthy Sister    Allergies  Allergen Reactions  . Nickel     Other reaction(s): Contact Dermatitis (intolerance) Ear to redden and hurt(rash?)  . Amoxicillin Rash    Current Outpatient Medications:  .  albuterol (PROAIR  HFA) 108 (90 Base) MCG/ACT inhaler, Inhale 2 puffs into the lungs as needed., Disp: 1 Inhaler, Rfl: 3 .  amphetamine-dextroamphetamine (ADDERALL) 5 MG tablet, Take 1 tablet (5 mg total) by mouth 2 (two) times daily with a meal., Disp: 60 tablet, Rfl: 0 .  naproxen (NAPROSYN) 500 MG tablet, Take 500 mg by mouth 2 (two) times daily with a meal., Disp: , Rfl:   Review of Systems  Constitutional: Negative for fever.  Musculoskeletal: Positive for stiffness. Negative for gout.  Skin: Negative for itching.  Neurological: Negative for tingling and numbness.   Social History   Tobacco Use  . Smoking status: Never Smoker  . Smokeless tobacco: Never Used  Substance Use Topics  . Alcohol use: No     Objective:   BP 110/72   Pulse 92   Temp 98.4 F (36.9 C) (Oral)   Resp 16   Wt 163 lb 3.2 oz (74 kg)   LMP 10/16/2018   BMI 30.84 kg/m  Vitals:   11/27/18 0812  BP: 110/72  Pulse: 92  Resp: 16  Temp: 98.4 F (36.9 C)  TempSrc: Oral  Weight: 163 lb 3.2 oz (74 kg)   Physical Exam Constitutional:      General: She is not in acute distress.    Appearance: She is well-developed.  HENT:     Head: Normocephalic and atraumatic.     Right Ear: Hearing normal.     Left Ear: Hearing normal.     Nose: Nose normal.  Eyes:     General: Lids are normal. No scleral icterus.       Right eye: No discharge.        Left eye: No discharge.     Conjunctiva/sclera: Conjunctivae normal.  Neck:     Musculoskeletal: Neck supple. Muscular tenderness present.     Comments: Soreness in the left side of neck and shoulder with stretching and testing UE strength. Cardiovascular:     Rate and Rhythm: Normal rate and regular rhythm.     Heart sounds: Normal heart sounds.  Pulmonary:     Effort: Pulmonary effort is normal. No respiratory distress.  Musculoskeletal:        General: Tenderness present.     Comments: Tenderness to palpate deltoid, trapezius and scapular muscles on the left. DTR's 2+ and  normal pulses. Good grip strength without discomfort. Good ROM.  Skin:    Findings: No lesion or rash.  Neurological:     Mental Status: She is alert and oriented to person, place, and time.  Psychiatric:        Speech: Speech normal.        Behavior: Behavior normal.        Thought Content: Thought content normal.       Assessment & Plan    1. Strain of left shoulder, initial encounter No specific injury known. Has had soreness and tenderness in the left shoulder, left neck and left scapular musculature over the past 2 weeks. Feels it may have been triggered with extra use since her right hip has been very painful and in the process of arranging hip surgery. May continue Icy-Hot and ice/heat applications prn. Not much help with OTC NSAID alone. Will add Robaxin for muscle relaxation and stretching exercises. Recheck if needed in 2 weeks. - methocarbamol (ROBAXIN) 500 MG tablet; Take 1 tablet (500 mg total) by mouth every 6 (six) hours as needed for muscle spasms.  Dispense: 30 tablet; Refill: 0  2. Hx of extrinsic asthma No recent flare but ran out of Albuterol inhaler for emergencies. Lungs clear to ascultation today. - albuterol (PROAIR HFA) 108 (90 Base) MCG/ACT inhaler; Inhale 2 puffs into the lungs as needed.  Dispense: 1 Inhaler; Refill: 3     Dortha Kern, PA  [facility] [place] as a Neurosurgeon for [employer], [employer].,have documented all relevant documentation on the behalf of Dortha Kern, PA,as directed by  [employer] while in the presence of [employer], [place].

## 2018-11-27 NOTE — Patient Instructions (Signed)
Shoulder Sprain    A shoulder sprain is a partial or complete tear in one of the tough, fiber-like tissues (ligaments) in the shoulder. The ligaments in the shoulder help to hold the shoulder in place.  What are the causes?  This condition may be caused by:   A fall.   A hit to the shoulder.   A twist of the arm.  What increases the risk?  You are more likely to develop this condition if you:   Play sports.   Have problems with balance or coordination.  What are the signs or symptoms?  Symptoms of this condition include:   Pain when moving the shoulder.   Limited ability to move the shoulder.   Swelling and tenderness on top of the shoulder.   Warmth in the shoulder.   A change in the shape of the shoulder.   Redness or bruising on the shoulder.  How is this diagnosed?  This condition is diagnosed with:   A physical exam. During the exam, you may be asked to do simple exercises with your shoulder.   Imaging tests such as X-rays, MRI, or a CT scan. These tests can show how severe the sprain is.  How is this treated?  This condition may be treated with:   Rest.   Pain medicine.   Ice.   A sling or brace. This is used to keep the arm still while the shoulder is healing.   Physical therapy or rehabilitation exercises. These help to improve the range of motion and strength of the shoulder.   Surgery (rare). Surgery may be needed if the sprain caused a joint to become unstable. Surgery may also be needed to reduce pain.  Some people may develop ongoing shoulder pain or lose some range of motion in the shoulder. However, most people do not develop long-term problems.  Follow these instructions at home:    If you have a sling or brace:   Wear the sling or brace as told by your health care provider. Remove it only as told by your health care provider.   Loosen the sling or brace if your fingers tingle, become numb, or turn cold and blue.   Keep the sling or brace clean.   If the sling or brace is not  waterproof:  ? Do not let it get wet.  ? Cover it with a watertight covering when you take a bath or shower.  Activity   Rest your shoulder.   Move your arm only as much as told by your health care provider, but move your hand and fingers often to prevent stiffness and swelling.   Return to your normal activities as told by your health care provider. Ask your health care provider what activities are safe for you.   Ask your health care provider when it is safe for you to drive if you have a sling or brace on your shoulder.   If you were shown how to do any exercises, do them as told by your health care provider.  General instructions   If directed, put ice on the affected area.  ? Put ice in a plastic bag.  ? Place a towel between your skin and the bag.  ? Leave the ice on for 20 minutes, 2-3 times a day.   Take over-the-counter and prescription medicines only as told by your health care provider.   Do not use any products that contain nicotine or tobacco, such as cigarettes, e-cigarettes, and   chewing tobacco. These can delay healing. If you need help quitting, ask your health care provider.   Keep all follow-up visits as told by your health care provider. This is important.  Contact a health care provider if:   Your pain gets worse.   Your pain is not relieved with medicines.   You have increased redness or swelling.  Get help right away if:   You have a fever.   You cannot move your arm or shoulder.   You develop severe numbness or tingling in your arm, hand, or fingers.   Your arm, hand, or fingers feel cold and turn blue, white, or gray.  Summary   A shoulder sprain is a partial or complete tear in one of the tough, fiber-like tissues (ligaments) in the shoulder.   This condition may be caused by a fall, a hit to the shoulder, or a twist of the arm.   Treatment usually includes rest, ice, and pain medicine as needed.   If you have a sling or brace, wear it as told by your health care  provider. Remove it only as told by your health care provider.  This information is not intended to replace advice given to you by your health care provider. Make sure you discuss any questions you have with your health care provider.  Document Released: 01/05/2009 Document Revised: 01/23/2018 Document Reviewed: 01/23/2018  Elsevier Interactive Patient Education  2019 [employer]

## 2018-12-03 ENCOUNTER — Telehealth: Payer: Self-pay

## 2018-12-03 ENCOUNTER — Other Ambulatory Visit: Payer: Self-pay | Admitting: Family Medicine

## 2018-12-03 DIAGNOSIS — S46912A Strain of unspecified muscle, fascia and tendon at shoulder and upper arm level, left arm, initial encounter: Secondary | ICD-10-CM

## 2018-12-03 MED ORDER — PREDNISONE 10 MG PO TABS: ORAL_TABLET | ORAL | 0 refills | Status: DC

## 2018-12-03 NOTE — Telephone Encounter (Signed)
Patient reports that robaxin is not working. Patient reports shoulder is more swollen and more pain. Patient reports that along with Robaxin she is taking tylenol and ibuprofen. # [id number]

## 2018-12-03 NOTE — Telephone Encounter (Signed)
Please advise 

## 2018-12-03 NOTE — Telephone Encounter (Signed)
Patient states the pain in the left posterior shoulder around the scapula is persistent despite the Robaxin muscle relaxer and Ibuprofen. Continues to attempt ROM exercises in the hot shower and rest the arm/shoulder. Will get [facility] taper [facility] and continue the Robaxin with rehab exercises. She agreed to call report of progress Monday 12-07-18. If no better, may need to try to get into the orthopedist or here for steroid injection and/or schedule PT.

## 2018-12-07 ENCOUNTER — Ambulatory Visit
Admission: RE | Admit: 2018-12-07 | Discharge: 2018-12-07 | Disposition: A | Discharge status: Home / Self Care | Payer: BLUE CROSS/BLUE SHIELD | Source: Ambulatory Visit | Attending: Family Medicine | Admitting: Family Medicine

## 2018-12-07 ENCOUNTER — Telehealth: Payer: Self-pay

## 2018-12-07 ENCOUNTER — Ambulatory Visit (INDEPENDENT_AMBULATORY_CARE_PROVIDER_SITE_OTHER): Payer: BLUE CROSS/BLUE SHIELD | Admitting: Family Medicine

## 2018-12-07 ENCOUNTER — Encounter: Payer: Self-pay | Admitting: Family Medicine

## 2018-12-07 ENCOUNTER — Other Ambulatory Visit: Payer: Self-pay

## 2018-12-07 ENCOUNTER — Other Ambulatory Visit: Payer: Self-pay | Admitting: Family Medicine

## 2018-12-07 VITALS — BP 90/52 | HR 58 | Temp 98.5°F | Wt 161.0 lb

## 2018-12-07 DIAGNOSIS — M542 Cervicalgia: Secondary | ICD-10-CM

## 2018-12-07 DIAGNOSIS — M25512 Pain in left shoulder: Secondary | ICD-10-CM | POA: Insufficient documentation

## 2018-12-07 DIAGNOSIS — R110 Nausea: Secondary | ICD-10-CM | POA: Diagnosis not present

## 2018-12-07 MED ORDER — PROMETHAZINE HCL 12.5 MG PO TABS: 12.5000 mg | ORAL_TABLET | Freq: Three times a day (TID) | ORAL | 0 refills | Status: DC | PRN

## 2018-12-07 NOTE — Telephone Encounter (Signed)
-----   Message from Tamsen Roers, [place] sent at 12/07/2018  1:47 PM EDT ----- No bony abnormality of the left shoulder or neck. Only mild swelling over the posterior C7 spinous process. Recommend continuing moist heat applications or Bio-Freeze with Lidocaine (or Aspercreme with Lidocaine). Will schedule physical therapy referral for evaluation and treatment.

## 2018-12-07 NOTE — Progress Notes (Signed)
Patient: Lauren Weber Female    DOB: May 04, 1991   27 y.o.   MRN: [id number] Visit Date: 12/07/2018  Today's Provider: Dortha Kern, PA   Chief Complaint  Patient presents with  . Shoulder Pain   Subjective:    The patient is a 28 year old female who presents for follow up of left shoulder pain.  She reports that she has developed a lump on the back of her neck and nausea since last week.  She was given Prednisone last week when she called in about the symptoms.    No past medical history on file.  Patient Active Problem List   Diagnosis Date Noted  . Primary osteoarthritis of right hip 11/04/2018  . Tear of right acetabular labrum 11/04/2018  . 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 DJD (degenerative joint disease) 06/17/2016  . Right hip pain 06/16/2015   Past Surgical History:  Procedure Laterality Date  . HIP SURGERY Left 06/15/2015   [facility]    Family History  Problem Relation Age of Onset  . Healthy Mother   . Healthy Father   . Healthy Sister   . Healthy Sister   . Healthy Sister    Allergies  Allergen Reactions  . Nickel     Other reaction(s): Contact Dermatitis (intolerance) Ear to redden and hurt(rash?)  . Amoxicillin Rash    Current Outpatient Medications:  .  albuterol (PROAIR HFA) 108 (90 Base) MCG/ACT inhaler, Inhale 2 puffs into the lungs as needed., Disp: 1 Inhaler, Rfl: 3 .  amphetamine-dextroamphetamine (ADDERALL) 5 MG tablet, Take 1 tablet (5 mg total) by mouth 2 (two) times daily with a meal., Disp: 60 tablet, Rfl: 0 .  predniSONE (DELTASONE) 10 MG tablet, Taper by one tablet by mouth daily (6,5,4,3,2,1) spread out at mealtimes and bedtime., Disp: 21 tablet, Rfl: 0 .  methocarbamol (ROBAXIN) 500 MG tablet, Take 1 tablet (500 mg total) by mouth every 6 (six) hours as needed for muscle spasms. (Patient not taking: Reported on 12/07/2018), Disp: 30 tablet, Rfl: 0 .  naproxen (NAPROSYN) 500 MG tablet, Take  500 mg by mouth 2 (two) times daily with a meal., Disp: , Rfl:   Review of Systems  Constitutional: Negative for fever.  Respiratory: Negative for cough and shortness of breath.   Gastrointestinal: Positive for nausea. Negative for abdominal pain, diarrhea and vomiting.  Musculoskeletal: Positive for arthralgias.       Neck swelling   Social History   Tobacco Use  . Smoking status: Never Smoker  . Smokeless tobacco: Never Used  Substance Use Topics  . Alcohol use: No     Objective:   BP (!) 90/52 (BP Location: Right Arm, Patient Position: Sitting, Cuff Size: Normal)   Pulse (!) 58   Temp 98.5 F (36.9 C) (Oral)   Wt 161 lb (73 kg)   SpO2 99%   BMI 30.42 kg/m  LMP-ended 5 days ago (normal 5 days of menses). Vitals:   12/07/18 1013  BP: (!) 90/52  Pulse: (!) 58  Temp: 98.5 F (36.9 C)  TempSrc: Oral  SpO2: 99%  Weight: 161 lb (73 kg)   Physical Exam Constitutional:      General: She is not in acute distress.    Appearance: She is well-developed.  HENT:     Head: Normocephalic and atraumatic.     Right Ear: Hearing and tympanic membrane normal.     Left Ear: Hearing and tympanic membrane  normal.     Nose: Nose normal.     Mouth/Throat:     Pharynx: Oropharynx is clear.  Eyes:     General: Lids are normal. No scleral icterus.       Right eye: No discharge.        Left eye: No discharge.     Conjunctiva/sclera: Conjunctivae normal.  Neck:     Musculoskeletal: Neck supple. Muscular tenderness present.     Comments: Tender over the posterior C7 spinous process. No significant swelling or redness. Good ROM. Cardiovascular:     Rate and Rhythm: Normal rate and regular rhythm.     Heart sounds: Normal heart sounds.  Pulmonary:     Effort: Pulmonary effort is normal. No respiratory distress.     Breath sounds: Normal breath sounds.  Abdominal:     General: Bowel sounds are normal.     Palpations: Abdomen is soft.  Musculoskeletal:        General: Tenderness  present.     Comments: Tender left shoulder over the biceps tendon with testing biceps strength. Testing triceps strength causes pain in the left inferior scapular border area. No crepitus in the shoulder. Good grip and arm strength.  Lymphadenopathy:     Cervical: No cervical adenopathy.  Skin:    Findings: No lesion or rash.  Neurological:     Mental Status: She is alert and oriented to person, place, and time.  Psychiatric:        Speech: Speech normal.        Behavior: Behavior normal.        Thought Content: Thought content normal.       Assessment & Plan    1. Left shoulder pain, unspecified chronicity Continues to have pain in the anterior and posterior left shoulder despite use of Robaxin and Prednisone. Moist heat, Bio-Freeze and hot Epsom Saltwater baths help a little. Will get x-ray evaluation and continue this regimen with ROM exercises. Should limit lifting more than 10 lbs. Recheck prn. - DG Shoulder Left  2. Posterior neck pain Onset over the past few days having pain/soreness in the posterior spinous process of C7. No known injury. Recommend continuing Bio-Freeze and moist heat prn (don't use together at the same moment). Will get x-ray evaluation. May need orthopedic referral and/or PT. - DG Cervical Spine Complete  3. Nausea Onset over the past few days since neck pain started. No fever, cough or shortness of breath. No diarrhea or vomiting. LMP ended 5 days ago after her usual 5 days of menses. Given promethazine for prn use. Increase fluids and recheck prn. - promethazine (PHENERGAN) 12.5 MG tablet; Take 1 tablet (12.5 mg total) by mouth every 8 (eight) hours as needed for nausea or vomiting.  Dispense: 20 tablet; Refill: 0     Dortha Kern, PA  [facility]

## 2018-12-07 NOTE — Telephone Encounter (Signed)
Pt advised.   Thanks,   -Laura  

## 2018-12-09 DIAGNOSIS — M542 Cervicalgia: Secondary | ICD-10-CM | POA: Diagnosis not present

## 2018-12-09 DIAGNOSIS — M25512 Pain in left shoulder: Secondary | ICD-10-CM | POA: Diagnosis not present

## 2018-12-14 DIAGNOSIS — M25512 Pain in left shoulder: Secondary | ICD-10-CM | POA: Diagnosis not present

## 2018-12-14 DIAGNOSIS — M542 Cervicalgia: Secondary | ICD-10-CM | POA: Diagnosis not present

## 2018-12-15 DIAGNOSIS — M545 Low back pain: Secondary | ICD-10-CM | POA: Diagnosis not present

## 2018-12-15 DIAGNOSIS — M9901 Segmental and somatic dysfunction of cervical region: Secondary | ICD-10-CM | POA: Diagnosis not present

## 2018-12-15 DIAGNOSIS — M546 Pain in thoracic spine: Secondary | ICD-10-CM | POA: Diagnosis not present

## 2018-12-15 DIAGNOSIS — M542 Cervicalgia: Secondary | ICD-10-CM | POA: Diagnosis not present

## 2018-12-17 DIAGNOSIS — M545 Low back pain: Secondary | ICD-10-CM | POA: Diagnosis not present

## 2018-12-17 DIAGNOSIS — M542 Cervicalgia: Secondary | ICD-10-CM | POA: Diagnosis not present

## 2018-12-17 DIAGNOSIS — M9901 Segmental and somatic dysfunction of cervical region: Secondary | ICD-10-CM | POA: Diagnosis not present

## 2018-12-17 DIAGNOSIS — M546 Pain in thoracic spine: Secondary | ICD-10-CM | POA: Diagnosis not present

## 2018-12-18 ENCOUNTER — Other Ambulatory Visit: Payer: Self-pay | Admitting: Family Medicine

## 2018-12-18 ENCOUNTER — Other Ambulatory Visit: Payer: Self-pay

## 2018-12-18 DIAGNOSIS — R110 Nausea: Secondary | ICD-10-CM

## 2018-12-18 DIAGNOSIS — M542 Cervicalgia: Secondary | ICD-10-CM | POA: Diagnosis not present

## 2018-12-18 DIAGNOSIS — M25512 Pain in left shoulder: Secondary | ICD-10-CM | POA: Diagnosis not present

## 2018-12-21 DIAGNOSIS — M25512 Pain in left shoulder: Secondary | ICD-10-CM | POA: Diagnosis not present

## 2018-12-21 DIAGNOSIS — M542 Cervicalgia: Secondary | ICD-10-CM | POA: Diagnosis not present

## 2018-12-22 DIAGNOSIS — M545 Low back pain: Secondary | ICD-10-CM | POA: Diagnosis not present

## 2018-12-22 DIAGNOSIS — M546 Pain in thoracic spine: Secondary | ICD-10-CM | POA: Diagnosis not present

## 2018-12-22 DIAGNOSIS — M9901 Segmental and somatic dysfunction of cervical region: Secondary | ICD-10-CM | POA: Diagnosis not present

## 2018-12-22 DIAGNOSIS — M542 Cervicalgia: Secondary | ICD-10-CM | POA: Diagnosis not present

## 2018-12-24 DIAGNOSIS — M546 Pain in thoracic spine: Secondary | ICD-10-CM | POA: Diagnosis not present

## 2018-12-24 DIAGNOSIS — M545 Low back pain: Secondary | ICD-10-CM | POA: Diagnosis not present

## 2018-12-24 DIAGNOSIS — M542 Cervicalgia: Secondary | ICD-10-CM | POA: Diagnosis not present

## 2018-12-24 DIAGNOSIS — M9901 Segmental and somatic dysfunction of cervical region: Secondary | ICD-10-CM | POA: Diagnosis not present

## 2018-12-25 DIAGNOSIS — M542 Cervicalgia: Secondary | ICD-10-CM | POA: Diagnosis not present

## 2018-12-25 DIAGNOSIS — M25512 Pain in left shoulder: Secondary | ICD-10-CM | POA: Diagnosis not present

## 2018-12-29 DIAGNOSIS — M542 Cervicalgia: Secondary | ICD-10-CM | POA: Diagnosis not present

## 2018-12-29 DIAGNOSIS — M25512 Pain in left shoulder: Secondary | ICD-10-CM | POA: Diagnosis not present

## 2018-12-31 ENCOUNTER — Other Ambulatory Visit: Payer: Self-pay | Admitting: Family Medicine

## 2018-12-31 DIAGNOSIS — Z1159 Encounter for screening for other viral diseases: Secondary | ICD-10-CM | POA: Diagnosis not present

## 2018-12-31 DIAGNOSIS — F988 Other specified behavioral and emotional disorders with onset usually occurring in childhood and adolescence: Secondary | ICD-10-CM

## 2018-12-31 MED ORDER — AMPHETAMINE-DEXTROAMPHETAMINE 5 MG PO TABS: 5.0000 mg | ORAL_TABLET | Freq: Two times a day (BID) | ORAL | 0 refills | Status: DC

## 2018-12-31 NOTE — Telephone Encounter (Signed)
Pt needs a refill on her  Adderall 5 mg  [facility]  [address]   CB# [phone]  Thanks Barth Kirks

## 2019-01-05 ENCOUNTER — Telehealth: Payer: Self-pay

## 2019-01-05 ENCOUNTER — Other Ambulatory Visit: Payer: Self-pay | Admitting: Family Medicine

## 2019-01-05 DIAGNOSIS — R112 Nausea with vomiting, unspecified: Secondary | ICD-10-CM

## 2019-01-05 DIAGNOSIS — Z9889 Other specified postprocedural states: Principal | ICD-10-CM

## 2019-01-05 MED ORDER — SCOPOLAMINE 1 MG/3DAYS TD PT72: 1.0000 | MEDICATED_PATCH | TRANSDERMAL | 0 refills | Status: DC

## 2019-01-05 NOTE — Telephone Encounter (Signed)
Patient called back to check on this message. She is scheduled to have a procedure at 7am in the morning and wants this rx  sent in ASAP so that she has it available when she needs it Please call patient.

## 2019-01-05 NOTE — Telephone Encounter (Signed)
Patient calling to request scopolamine patch prescription to be send to her pharmacy. [facility] [place]. Reports that her appointment with ortho got changed.

## 2019-01-05 NOTE — Telephone Encounter (Signed)
Orthopedic surgeon (Dr. Dorris Carnes) finally able to schedule surgery of right hip labrum tear. It is set up for tomorrow at 7:00 am and she will need the Transderm Scop patch to prevent pre and post-operative nausea and vomiting as she has had to use with past surgeries. [facility] [address]. Advised by surgeon to apply patch tonight and we encouraged her to make sure anesthesiologist is aware of its use in the morning. Patient understands and agrees.

## 2019-01-06 DIAGNOSIS — M1611 Unilateral primary osteoarthritis, right hip: Secondary | ICD-10-CM | POA: Diagnosis not present

## 2019-01-06 HISTORY — PX: TOTAL HIP ARTHROPLASTY: SHX124

## 2019-01-06 HISTORY — PX: HIP SURGERY: SHX245

## 2019-01-12 DIAGNOSIS — Z96641 Presence of right artificial hip joint: Secondary | ICD-10-CM | POA: Diagnosis not present

## 2019-01-12 DIAGNOSIS — R262 Difficulty in walking, not elsewhere classified: Secondary | ICD-10-CM | POA: Diagnosis not present

## 2019-01-12 DIAGNOSIS — M25651 Stiffness of right hip, not elsewhere classified: Secondary | ICD-10-CM | POA: Diagnosis not present

## 2019-01-12 DIAGNOSIS — R29898 Other symptoms and signs involving the musculoskeletal system: Secondary | ICD-10-CM | POA: Diagnosis not present

## 2019-01-14 DIAGNOSIS — R262 Difficulty in walking, not elsewhere classified: Secondary | ICD-10-CM | POA: Diagnosis not present

## 2019-01-14 DIAGNOSIS — M25551 Pain in right hip: Secondary | ICD-10-CM | POA: Diagnosis not present

## 2019-01-14 DIAGNOSIS — M25651 Stiffness of right hip, not elsewhere classified: Secondary | ICD-10-CM | POA: Diagnosis not present

## 2019-01-14 DIAGNOSIS — R29898 Other symptoms and signs involving the musculoskeletal system: Secondary | ICD-10-CM | POA: Diagnosis not present

## 2019-01-19 DIAGNOSIS — M25651 Stiffness of right hip, not elsewhere classified: Secondary | ICD-10-CM | POA: Diagnosis not present

## 2019-01-19 DIAGNOSIS — M25551 Pain in right hip: Secondary | ICD-10-CM | POA: Diagnosis not present

## 2019-01-19 DIAGNOSIS — R262 Difficulty in walking, not elsewhere classified: Secondary | ICD-10-CM | POA: Diagnosis not present

## 2019-01-19 DIAGNOSIS — R29898 Other symptoms and signs involving the musculoskeletal system: Secondary | ICD-10-CM | POA: Diagnosis not present

## 2019-01-21 DIAGNOSIS — R29898 Other symptoms and signs involving the musculoskeletal system: Secondary | ICD-10-CM | POA: Diagnosis not present

## 2019-01-21 DIAGNOSIS — R262 Difficulty in walking, not elsewhere classified: Secondary | ICD-10-CM | POA: Diagnosis not present

## 2019-01-21 DIAGNOSIS — M25651 Stiffness of right hip, not elsewhere classified: Secondary | ICD-10-CM | POA: Diagnosis not present

## 2019-01-21 DIAGNOSIS — M25551 Pain in right hip: Secondary | ICD-10-CM | POA: Diagnosis not present

## 2019-01-26 DIAGNOSIS — R262 Difficulty in walking, not elsewhere classified: Secondary | ICD-10-CM | POA: Diagnosis not present

## 2019-01-26 DIAGNOSIS — M25551 Pain in right hip: Secondary | ICD-10-CM | POA: Diagnosis not present

## 2019-01-26 DIAGNOSIS — M25651 Stiffness of right hip, not elsewhere classified: Secondary | ICD-10-CM | POA: Diagnosis not present

## 2019-01-26 DIAGNOSIS — R29898 Other symptoms and signs involving the musculoskeletal system: Secondary | ICD-10-CM | POA: Diagnosis not present

## 2019-01-28 DIAGNOSIS — M25551 Pain in right hip: Secondary | ICD-10-CM | POA: Diagnosis not present

## 2019-01-28 DIAGNOSIS — R29898 Other symptoms and signs involving the musculoskeletal system: Secondary | ICD-10-CM | POA: Diagnosis not present

## 2019-01-28 DIAGNOSIS — M25651 Stiffness of right hip, not elsewhere classified: Secondary | ICD-10-CM | POA: Diagnosis not present

## 2019-01-28 DIAGNOSIS — R262 Difficulty in walking, not elsewhere classified: Secondary | ICD-10-CM | POA: Diagnosis not present

## 2019-01-29 ENCOUNTER — Telehealth: Payer: Self-pay

## 2019-01-29 NOTE — Telephone Encounter (Signed)
Patient needs a referral for appointment scheduled 02/24/2019 with Dr. Reginia Naas, hip replacement specialist. Patient states she had her surgery on May 6 and just changed insurance coverage. Her insurance requires that she has a referral before seeing the provider.

## 2019-01-29 NOTE — Telephone Encounter (Signed)
Please review chart for referral request below. KW

## 2019-01-29 NOTE — Telephone Encounter (Signed)
Patient also needs a referral to her physical therapist, Claudia Pollock at [facility] for hip pain.

## 2019-01-29 NOTE — Telephone Encounter (Signed)
Schedule surgical follow post op right hip labrum tear with Dr. Dory Larsen and post op physical therapy with Claudia Pollock at [facility] if approved by Dr. Dory Larsen (surgeon).

## 2019-02-01 ENCOUNTER — Telehealth: Payer: Self-pay

## 2019-02-01 NOTE — Telephone Encounter (Signed)
Lauren Weber, the patient had surgery and before having her post op follow up her insurance changed.  She does not need to have any appointment made at this time she needs referral authorization for the surgeon.  Also she has in her note that she wants to see a specific therapist.  That order will need to come from the surgeon, not our office.  I am putting in an order for the orthopedist but I wanted to give you a heads up on what was going on.   Thanks [employer]

## 2019-02-01 NOTE — Telephone Encounter (Signed)
Pt called and is asking for referrals one to John shields at [facility] [address] [place]  [phone] and one to PT at [facility] at Dr Sherlean Foot at [address] [employer].  [phone].   Call back # [phone].

## 2019-02-01 NOTE — Telephone Encounter (Signed)
This has to go through insurance and order was placed 01-29-19. The surgeon will need to give specific instructions as to what he wants physical therapist to do.

## 2019-02-02 NOTE — Telephone Encounter (Signed)
Pt states that her insurance just changed to [facility] and had been seeing therapist and surgeon before change of insurance.Pt was advised that I will need member ID number and phone # for insurance.She states that her mom will fax copy of card when she gets it to my fax number that has been provided with names and address of doctor she will be seeing

## 2019-02-02 NOTE — Telephone Encounter (Signed)
Patient advised.

## 2019-02-11 DIAGNOSIS — M25651 Stiffness of right hip, not elsewhere classified: Secondary | ICD-10-CM | POA: Diagnosis not present

## 2019-02-11 DIAGNOSIS — Z96641 Presence of right artificial hip joint: Secondary | ICD-10-CM | POA: Diagnosis not present

## 2019-02-11 DIAGNOSIS — M25551 Pain in right hip: Secondary | ICD-10-CM | POA: Diagnosis not present

## 2019-02-11 DIAGNOSIS — R262 Difficulty in walking, not elsewhere classified: Secondary | ICD-10-CM | POA: Diagnosis not present

## 2019-02-11 DIAGNOSIS — R29898 Other symptoms and signs involving the musculoskeletal system: Secondary | ICD-10-CM | POA: Diagnosis not present

## 2019-02-16 DIAGNOSIS — Z96641 Presence of right artificial hip joint: Secondary | ICD-10-CM | POA: Diagnosis not present

## 2019-02-16 DIAGNOSIS — R29898 Other symptoms and signs involving the musculoskeletal system: Secondary | ICD-10-CM | POA: Diagnosis not present

## 2019-02-16 DIAGNOSIS — R262 Difficulty in walking, not elsewhere classified: Secondary | ICD-10-CM | POA: Diagnosis not present

## 2019-02-16 DIAGNOSIS — M25651 Stiffness of right hip, not elsewhere classified: Secondary | ICD-10-CM | POA: Diagnosis not present

## 2019-02-18 DIAGNOSIS — M25651 Stiffness of right hip, not elsewhere classified: Secondary | ICD-10-CM | POA: Diagnosis not present

## 2019-02-18 DIAGNOSIS — R262 Difficulty in walking, not elsewhere classified: Secondary | ICD-10-CM | POA: Diagnosis not present

## 2019-02-18 DIAGNOSIS — R29898 Other symptoms and signs involving the musculoskeletal system: Secondary | ICD-10-CM | POA: Diagnosis not present

## 2019-02-18 DIAGNOSIS — Z96641 Presence of right artificial hip joint: Secondary | ICD-10-CM | POA: Diagnosis not present

## 2019-02-23 DIAGNOSIS — R262 Difficulty in walking, not elsewhere classified: Secondary | ICD-10-CM | POA: Diagnosis not present

## 2019-02-23 DIAGNOSIS — M25651 Stiffness of right hip, not elsewhere classified: Secondary | ICD-10-CM | POA: Diagnosis not present

## 2019-02-23 DIAGNOSIS — R29898 Other symptoms and signs involving the musculoskeletal system: Secondary | ICD-10-CM | POA: Diagnosis not present

## 2019-02-23 DIAGNOSIS — Z96641 Presence of right artificial hip joint: Secondary | ICD-10-CM | POA: Diagnosis not present

## 2019-02-24 DIAGNOSIS — Z471 Aftercare following joint replacement surgery: Secondary | ICD-10-CM | POA: Diagnosis not present

## 2019-02-24 DIAGNOSIS — Z96641 Presence of right artificial hip joint: Secondary | ICD-10-CM | POA: Diagnosis not present

## 2019-02-25 DIAGNOSIS — Z96641 Presence of right artificial hip joint: Secondary | ICD-10-CM | POA: Diagnosis not present

## 2019-02-25 DIAGNOSIS — R262 Difficulty in walking, not elsewhere classified: Secondary | ICD-10-CM | POA: Diagnosis not present

## 2019-02-25 DIAGNOSIS — M25651 Stiffness of right hip, not elsewhere classified: Secondary | ICD-10-CM | POA: Diagnosis not present

## 2019-02-25 DIAGNOSIS — R29898 Other symptoms and signs involving the musculoskeletal system: Secondary | ICD-10-CM | POA: Diagnosis not present

## 2019-03-04 DIAGNOSIS — M25651 Stiffness of right hip, not elsewhere classified: Secondary | ICD-10-CM | POA: Diagnosis not present

## 2019-03-04 DIAGNOSIS — Z96641 Presence of right artificial hip joint: Secondary | ICD-10-CM | POA: Diagnosis not present

## 2019-03-04 DIAGNOSIS — R262 Difficulty in walking, not elsewhere classified: Secondary | ICD-10-CM | POA: Diagnosis not present

## 2019-03-04 DIAGNOSIS — R29898 Other symptoms and signs involving the musculoskeletal system: Secondary | ICD-10-CM | POA: Diagnosis not present

## 2019-03-09 DIAGNOSIS — R29898 Other symptoms and signs involving the musculoskeletal system: Secondary | ICD-10-CM | POA: Diagnosis not present

## 2019-03-09 DIAGNOSIS — R262 Difficulty in walking, not elsewhere classified: Secondary | ICD-10-CM | POA: Diagnosis not present

## 2019-03-09 DIAGNOSIS — M25651 Stiffness of right hip, not elsewhere classified: Secondary | ICD-10-CM | POA: Diagnosis not present

## 2019-03-09 DIAGNOSIS — Z96641 Presence of right artificial hip joint: Secondary | ICD-10-CM | POA: Diagnosis not present

## 2019-03-11 DIAGNOSIS — R29898 Other symptoms and signs involving the musculoskeletal system: Secondary | ICD-10-CM | POA: Diagnosis not present

## 2019-03-11 DIAGNOSIS — R262 Difficulty in walking, not elsewhere classified: Secondary | ICD-10-CM | POA: Diagnosis not present

## 2019-03-11 DIAGNOSIS — M25651 Stiffness of right hip, not elsewhere classified: Secondary | ICD-10-CM | POA: Diagnosis not present

## 2019-03-11 DIAGNOSIS — Z96641 Presence of right artificial hip joint: Secondary | ICD-10-CM | POA: Diagnosis not present

## 2019-03-18 DIAGNOSIS — M25651 Stiffness of right hip, not elsewhere classified: Secondary | ICD-10-CM | POA: Diagnosis not present

## 2019-03-18 DIAGNOSIS — R29898 Other symptoms and signs involving the musculoskeletal system: Secondary | ICD-10-CM | POA: Diagnosis not present

## 2019-03-18 DIAGNOSIS — M25551 Pain in right hip: Secondary | ICD-10-CM | POA: Diagnosis not present

## 2019-03-18 DIAGNOSIS — R262 Difficulty in walking, not elsewhere classified: Secondary | ICD-10-CM | POA: Diagnosis not present

## 2019-03-18 DIAGNOSIS — Z96641 Presence of right artificial hip joint: Secondary | ICD-10-CM | POA: Diagnosis not present

## 2019-03-23 DIAGNOSIS — M25551 Pain in right hip: Secondary | ICD-10-CM | POA: Diagnosis not present

## 2019-03-23 DIAGNOSIS — R262 Difficulty in walking, not elsewhere classified: Secondary | ICD-10-CM | POA: Diagnosis not present

## 2019-03-23 DIAGNOSIS — Z96641 Presence of right artificial hip joint: Secondary | ICD-10-CM | POA: Diagnosis not present

## 2019-03-23 DIAGNOSIS — R29898 Other symptoms and signs involving the musculoskeletal system: Secondary | ICD-10-CM | POA: Diagnosis not present

## 2019-03-23 DIAGNOSIS — M25651 Stiffness of right hip, not elsewhere classified: Secondary | ICD-10-CM | POA: Diagnosis not present

## 2019-03-24 DIAGNOSIS — S7001XA Contusion of right hip, initial encounter: Secondary | ICD-10-CM | POA: Diagnosis not present

## 2019-03-25 ENCOUNTER — Encounter: Payer: Self-pay | Admitting: Emergency Medicine

## 2019-03-25 ENCOUNTER — Emergency Department: Payer: 59

## 2019-03-25 ENCOUNTER — Emergency Department
Admission: EM | Admit: 2019-03-25 | Discharge: 2019-03-25 | Disposition: A | Discharge status: Home / Self Care | Payer: 59 | Attending: Emergency Medicine | Admitting: Emergency Medicine

## 2019-03-25 ENCOUNTER — Other Ambulatory Visit: Payer: Self-pay

## 2019-03-25 DIAGNOSIS — S62232D Other displaced fracture of base of first metacarpal bone, left hand, subsequent encounter for fracture with routine healing: Secondary | ICD-10-CM | POA: Diagnosis not present

## 2019-03-25 DIAGNOSIS — Y999 Unspecified external cause status: Secondary | ICD-10-CM | POA: Insufficient documentation

## 2019-03-25 DIAGNOSIS — S7001XA Contusion of right hip, initial encounter: Secondary | ICD-10-CM | POA: Insufficient documentation

## 2019-03-25 DIAGNOSIS — W01198A Fall on same level from slipping, tripping and stumbling with subsequent striking against other object, initial encounter: Secondary | ICD-10-CM | POA: Diagnosis not present

## 2019-03-25 DIAGNOSIS — S79911A Unspecified injury of right hip, initial encounter: Secondary | ICD-10-CM | POA: Diagnosis not present

## 2019-03-25 DIAGNOSIS — J45909 Unspecified asthma, uncomplicated: Secondary | ICD-10-CM | POA: Diagnosis not present

## 2019-03-25 DIAGNOSIS — Y92000 Kitchen of unspecified non-institutional (private) residence as  the place of occurrence of the external cause: Secondary | ICD-10-CM | POA: Diagnosis not present

## 2019-03-25 DIAGNOSIS — Z79899 Other long term (current) drug therapy: Secondary | ICD-10-CM | POA: Insufficient documentation

## 2019-03-25 DIAGNOSIS — Y9389 Activity, other specified: Secondary | ICD-10-CM | POA: Diagnosis not present

## 2019-03-25 DIAGNOSIS — S79811A Other specified injuries of right hip, initial encounter: Secondary | ICD-10-CM | POA: Diagnosis present

## 2019-03-25 DIAGNOSIS — M25551 Pain in right hip: Secondary | ICD-10-CM | POA: Diagnosis not present

## 2019-03-25 DIAGNOSIS — S62202A Unspecified fracture of first metacarpal bone, left hand, initial encounter for closed fracture: Secondary | ICD-10-CM | POA: Diagnosis not present

## 2019-03-25 DIAGNOSIS — W19XXXA Unspecified fall, initial encounter: Secondary | ICD-10-CM

## 2019-03-25 HISTORY — DX: Unspecified asthma, uncomplicated: J45.909

## 2019-03-25 LAB — POCT PREGNANCY, URINE: Preg Test, Ur: NEGATIVE

## 2019-03-25 MED ORDER — OXYCODONE-ACETAMINOPHEN 5-325 MG PO TABS
1.0000 | ORAL_TABLET | Freq: Once | ORAL | Status: AC
  Administered 2019-03-25: 1 via ORAL
  Filled 2019-03-25: qty 1

## 2019-03-25 MED ORDER — OXYCODONE-ACETAMINOPHEN 5-325 MG PO TABS: 1.0000 | ORAL_TABLET | ORAL | 0 refills | Status: DC | PRN

## 2019-03-25 NOTE — ED Provider Notes (Signed)
[facility] Emergency Department Provider Note  ____________________________________________  Time seen: Approximately 3:28 AM  I have reviewed the triage vital signs and the nursing notes.   HISTORY  Chief Complaint Hip Pain   HPI Lauren Weber is a 28 y.o. female with history of osteoarthritis of bilateral hips status post hip replacement approximately 10 weeks ago who presents for evaluation of right hip pain status post mechanical fall.  Patient reports that she slipped and fell into her kitchen today.  She fell onto the right hip.  She is complaining of sharp pain located in the right anterior hip radiating to the back of her leg and down to her knee.  The pain is worse with ambulation or movement.  She has taken Tylenol at home with no significant relief.  She denies head trauma or LOC, neck pain or back pain.  She is able to ambulate.   Past Medical History:  Diagnosis Date   Asthma     Patient Active Problem List   Diagnosis Date Noted   Primary osteoarthritis of right hip 11/04/2018   Tear of right acetabular labrum 11/04/2018   Attention deficit disorder (ADD) without hyperactivity 04/08/2018   DJD (degenerative joint disease) 06/17/2016   Right hip pain 06/16/2015    Past Surgical History:  Procedure Laterality Date   HIP SURGERY Left 06/15/2015   [facility]     Prior to Admission medications   Medication Sig Start Date End Date Taking? Authorizing Provider  albuterol (PROAIR HFA) 108 (90 Base) MCG/ACT inhaler Inhale 2 puffs into the lungs as needed. 11/27/18   Chrismon, Vickki Muff, PA  amphetamine-dextroamphetamine (ADDERALL) 5 MG tablet Take 1 tablet (5 mg total) by mouth 2 (two) times daily with a meal. 12/31/18   Chrismon, Vickki Muff, PA  methocarbamol (ROBAXIN) 500 MG tablet Take 1 tablet (500 mg total) by mouth every 6 (six) hours as needed for muscle spasms. Patient not taking: Reported on 12/07/2018 11/27/18   Chrismon, Vickki Muff, PA  naproxen (NAPROSYN) 500 MG tablet Take 500 mg by mouth 2 (two) times daily with a meal.    [provider]  oxyCODONE-acetaminophen (PERCOCET) 5-325 MG tablet Take 1 tablet by mouth every 4 (four) hours as needed. 03/25/19   Rudene Re, MD  predniSONE (DELTASONE) 10 MG tablet Taper by one tablet by mouth daily (6,5,4,3,2,1) spread out at mealtimes and bedtime. 12/03/18   Chrismon, Vickki Muff, PA  promethazine (PHENERGAN) 12.5 MG tablet TAKE 1 TABLET (12.5 MG TOTAL) BY MOUTH EVERY 8 (EIGHT) HOURS AS NEEDED FOR NAUSEA OR VOMITING. 12/18/18   Chrismon, Vickki Muff, PA  scopolamine (TRANSDERM-SCOP) 1 MG/3DAYS Place 1 patch (1.5 mg total) onto the skin every 3 (three) days. 01/05/19   Chrismon, Vickki Muff, PA    Allergies Nickel and Amoxicillin  Family History  Problem Relation Age of Onset   Healthy Mother    Healthy Father    Healthy Sister    Healthy Sister    Healthy Sister     Social History Social History   Tobacco Use   Smoking status: Never Smoker   Smokeless tobacco: Never Used  Substance Use Topics   Alcohol use: Yes   Drug use: No    Review of Systems  Constitutional: Negative for fever. Eyes: Negative for visual changes. ENT: Negative for facial injury or neck injury Cardiovascular: Negative for chest injury. Respiratory: Negative for shortness of breath. Negative for chest wall injury. Gastrointestinal: Negative for abdominal pain  or injury. Genitourinary: Negative for dysuria. Musculoskeletal: Negative for back injury, + R hip pain Skin: Negative for laceration/abrasions. Neurological: Negative for head injury.   ____________________________________________   PHYSICAL EXAM:  VITAL SIGNS: ED Triage Vitals  Enc Vitals Group     BP 03/25/19 0040 113/82     Pulse Rate 03/25/19 0040 75     Resp 03/25/19 0040 18     Temp 03/25/19 0040 98.8 F (37.1 C)     Temp Source 03/25/19 0040 Oral     SpO2 03/25/19 0040 100 %     Weight 03/25/19  0048 155 lb (70.3 kg)     Height 03/25/19 0048 5\' 4"  (1.626 m)     Head Circumference --      Peak Flow --      Pain Score 03/25/19 0047 8     Pain Loc --      Pain Edu? --      Excl. in GC? --     Constitutional: Alert and oriented. No acute distress. Does not appear intoxicated. HEENT Head: Normocephalic and atraumatic. Face: No facial bony tenderness. Stable midface Ears: No hemotympanum bilaterally. No Battle sign Eyes: No eye injury. PERRL. No raccoon eyes Nose: Nontender. No epistaxis. No rhinorrhea Mouth/Throat: Mucous membranes are moist. No oropharyngeal blood. No dental injury. Airway patent without stridor. Normal voice. Neck: no C-collar. No midline c-spine tenderness.  Cardiovascular: Normal rate, regular rhythm. Normal and symmetric distal pulses are present in all extremities. Pulmonary/Chest: Chest wall is stable and nontender to palpation/compression. Normal respiratory effort. Breath sounds are normal. No crepitus.  Abdominal: Soft, nontender, non distended. Musculoskeletal: Nontender with normal full range of motion in all extremities. No deformities. No thoracic or lumbar midline spinal tenderness. Pelvis is stable. Skin: Skin is warm, dry and intact. No abrasions or contutions. Psychiatric: Speech and behavior are appropriate. Neurological: Normal speech and language. Moves all extremities to command. No gross focal neurologic deficits are appreciated.  Glascow Coma Score: 4 - Opens eyes on own 6 - Follows simple motor commands 5 - Alert and oriented GCS: 15   ____________________________________________   LABS (all labs ordered are listed, but only abnormal results are displayed)  Labs Reviewed  POCT PREGNANCY, URINE   ____________________________________________  EKG  none  ____________________________________________  RADIOLOGY  I have personally reviewed the images performed during this visit and I agree with the Radiologist's  read.   Interpretation by Radiologist:  Dg Pelvis 1-2 Views  Result Date: 03/25/2019 CLINICAL DATA:  Right hip pain after fall on hardwood floor. EXAM: PELVIS - 1-2 VIEW COMPARISON:  None. FINDINGS: Bilateral total hip arthroplasties. Distal femoral stems are not included in the field of view. No periprosthetic fracture where visualized. Pubic rami are intact. Pubic symphysis and sacroiliac joints are congruent. IMPRESSION: Bilateral total hip arthroplasties. No acute or periprosthetic fracture. Distal aspect of the femoral stems are not included in the field of view. Electronically Signed   By: Narda RutherfordMelanie  Sanford M.D.   On: 03/25/2019 01:57   Dg Hip Unilat W Or Wo Pelvis 2-3 Views Right  Result Date: 03/25/2019 CLINICAL DATA:  Post fall with right hip pain. EXAM: DG HIP (WITH OR WITHOUT PELVIS) 2-3V RIGHT COMPARISON:  Pelvis x-ray earlier tonight. FINDINGS: Right hip arthroplasty in expected alignment. No periprosthetic lucency or fracture. Pubic rami are intact without acute fracture. Included sacral ala are maintained. No focal soft tissue abnormality. IMPRESSION: Right hip arthroplasty without complication or acute fracture. Electronically Signed   By: Shawna OrleansMelanie  Sanford M.D.   On: 03/25/2019 03:14     ____________________________________________   PROCEDURES  Procedure(s) performed: None Procedures Critical Care performed:  None ____________________________________________   INITIAL IMPRESSION / ASSESSMENT AND PLAN / ED COURSE   28 y.o. female with history of osteoarthritis of bilateral hips status post hip replacement approximately 10 weeks ago who presents for evaluation of right hip pain status post mechanical fall.  Patient is very benign exam with no tenderness palpation, full painless range of motion of the right lower extremity, no CT and L-spine tenderness.  X-ray of the pelvis and right hip showing no fracture or dislocation.  Most likely contusion.  Will give a short course of  Percocet.  Recommended follow-up with orthopedic surgeon.  Discussed my standard return precautions.       As part of my medical decision making, I reviewed the following data within the electronic MEDICAL RECORD NUMBER Nursing notes reviewed and incorporated, Old chart reviewed, Radiograph reviewed , Notes from prior ED visits and [place] Controlled Substance Database   Patient was evaluated in Emergency Department today for the symptoms described in the history of present illness. Patient was evaluated in the context of the global COVID-19 pandemic, which necessitated consideration that the patient might be at risk for infection with the SARS-CoV-2 virus that causes COVID-19. Institutional protocols and algorithms that pertain to the evaluation of patients at risk for COVID-19 are in a state of rapid change based on information released by regulatory bodies including the CDC and federal and state organizations. These policies and algorithms were followed during the patient's care in the ED.   ____________________________________________   FINAL CLINICAL IMPRESSION(S) / ED DIAGNOSES   Final diagnoses:  Fall, initial encounter  Contusion of right hip, initial encounter      NEW MEDICATIONS STARTED DURING THIS VISIT:  ED Discharge Orders         Ordered    oxyCODONE-acetaminophen (PERCOCET) 5-325 MG tablet  Every 4 hours PRN     03/25/19 0324           Note:  This document was prepared using Dragon voice recognition software and may include unintentional dictation errors.    Don PerkingVeronese, [place], MD 03/25/19 0330

## 2019-03-25 NOTE — Discharge Instructions (Addendum)
Ice, rest, and elevate. Follow up with your orthopedic surgeon.

## 2019-03-25 NOTE — ED Triage Notes (Signed)
Pt presents to ED with right hip pain that radiates to her lower back and knee after falling on hardwood floor at 1600. Pt states she had a full hip replacement approx 10 weeks ago at [facility]. Pt states she has noticed swelling to her hip since falling. otc medications taken with no improvement.

## 2019-03-30 DIAGNOSIS — M25551 Pain in right hip: Secondary | ICD-10-CM | POA: Diagnosis not present

## 2019-03-30 DIAGNOSIS — R262 Difficulty in walking, not elsewhere classified: Secondary | ICD-10-CM | POA: Diagnosis not present

## 2019-03-30 DIAGNOSIS — M25651 Stiffness of right hip, not elsewhere classified: Secondary | ICD-10-CM | POA: Diagnosis not present

## 2019-03-30 DIAGNOSIS — Z96641 Presence of right artificial hip joint: Secondary | ICD-10-CM | POA: Diagnosis not present

## 2019-03-30 DIAGNOSIS — R29898 Other symptoms and signs involving the musculoskeletal system: Secondary | ICD-10-CM | POA: Diagnosis not present

## 2019-04-01 DIAGNOSIS — Z96641 Presence of right artificial hip joint: Secondary | ICD-10-CM | POA: Diagnosis not present

## 2019-04-01 DIAGNOSIS — M25551 Pain in right hip: Secondary | ICD-10-CM | POA: Diagnosis not present

## 2019-04-01 DIAGNOSIS — R29898 Other symptoms and signs involving the musculoskeletal system: Secondary | ICD-10-CM | POA: Diagnosis not present

## 2019-04-01 DIAGNOSIS — R262 Difficulty in walking, not elsewhere classified: Secondary | ICD-10-CM | POA: Diagnosis not present

## 2019-04-01 DIAGNOSIS — M25651 Stiffness of right hip, not elsewhere classified: Secondary | ICD-10-CM | POA: Diagnosis not present

## 2019-04-05 ENCOUNTER — Other Ambulatory Visit: Payer: Self-pay

## 2019-04-05 DIAGNOSIS — F988 Other specified behavioral and emotional disorders with onset usually occurring in childhood and adolescence: Secondary | ICD-10-CM

## 2019-04-05 NOTE — Telephone Encounter (Signed)
Please review

## 2019-04-06 ENCOUNTER — Telehealth: Payer: Self-pay | Admitting: Family Medicine

## 2019-04-06 MED ORDER — AMPHETAMINE-DEXTROAMPHETAMINE 5 MG PO TABS: 5.0000 mg | ORAL_TABLET | Freq: Two times a day (BID) | ORAL | 0 refills | Status: DC

## 2019-04-06 NOTE — Telephone Encounter (Signed)
Appt made

## 2019-04-06 NOTE — Telephone Encounter (Signed)
Please review

## 2019-04-06 NOTE — Telephone Encounter (Signed)
Check with patient to find out which pharmacy to use.

## 2019-04-06 NOTE — Telephone Encounter (Signed)
Audrea Muscat with [facility]  [phone]   [phone]  Says pt's medication was sent to the wrong pharmacy.  Her pharmacy only gives discharge medications.  amphetamine-dextroamphetamine (ADDERALL) 5 MG tablet  Thanks, [place]

## 2019-04-06 NOTE — Telephone Encounter (Signed)
Need to schedule virtual visit and fasting labs to maintain this prescription and get future refills.

## 2019-04-08 ENCOUNTER — Other Ambulatory Visit: Payer: Self-pay | Admitting: Family Medicine

## 2019-04-08 ENCOUNTER — Telehealth: Payer: Self-pay | Admitting: Family Medicine

## 2019-04-08 DIAGNOSIS — F988 Other specified behavioral and emotional disorders with onset usually occurring in childhood and adolescence: Secondary | ICD-10-CM

## 2019-04-08 MED ORDER — AMPHETAMINE-DEXTROAMPHETAMINE 5 MG PO TABS: 5.0000 mg | ORAL_TABLET | Freq: Two times a day (BID) | ORAL | 0 refills | Status: DC

## 2019-04-08 NOTE — Telephone Encounter (Signed)
Sent prescription to the [facility]. Remind patient she is due for follow up appointment and labs in the next 4-6 weeks.

## 2019-04-08 NOTE — Telephone Encounter (Signed)
Pt stated that the Rx for amphetamine-dextroamphetamine (ADDERALL) 5 MG tablet was sent to [facility] and she isn't able to get the medication. Pt is requesting Rx be sent to [facility]. Please advise. Thanks TNP

## 2019-04-09 NOTE — Telephone Encounter (Signed)
Patient notified. She has an appointment for next week.

## 2019-04-13 ENCOUNTER — Other Ambulatory Visit: Payer: Self-pay

## 2019-04-13 ENCOUNTER — Encounter: Payer: Self-pay | Admitting: Family Medicine

## 2019-04-13 ENCOUNTER — Ambulatory Visit (INDEPENDENT_AMBULATORY_CARE_PROVIDER_SITE_OTHER): Payer: 59 | Admitting: Family Medicine

## 2019-04-13 VITALS — BP 106/70 | HR 75 | Temp 98.4°F | Resp 16 | Ht 61.0 in | Wt 163.2 lb

## 2019-04-13 DIAGNOSIS — Z96643 Presence of artificial hip joint, bilateral: Secondary | ICD-10-CM

## 2019-04-13 DIAGNOSIS — F988 Other specified behavioral and emotional disorders with onset usually occurring in childhood and adolescence: Secondary | ICD-10-CM | POA: Diagnosis not present

## 2019-04-13 NOTE — Progress Notes (Signed)
Patient: Lauren Weber Female    DOB: 1990/10/10   28 y.o.   MRN: [id number] Visit Date: 04/13/2019  Today's Provider: Vernie Murders, PA   Chief Complaint  Patient presents with  . Follow-up    ADD   Subjective:     HPI  Follow up for ADD  The patient was last seen for this 11 months ago. Changes made at last visit include no changes.  She reports excellent compliance with treatment. She feels that condition is Improved. She is not having side effects.   ------------------------------------------------------------------------------------  Past Medical History:  Diagnosis Date  . Asthma    Past Surgical History:  Procedure Laterality Date  . HIP SURGERY Left 06/15/2015   [facility]   . HIP SURGERY Right 01/06/2019   osteoarthritic degeneration   Allergies  Allergen Reactions  . Nickel     Other reaction(s): Contact Dermatitis (intolerance) Ear to redden and hurt(rash?)  . Amoxicillin Rash    Current Outpatient Medications:  .  acetaminophen (TYLENOL) 650 MG CR tablet, Take 650 mg by mouth every 8 (eight) hours as needed for pain., Disp: , Rfl:  .  albuterol (PROAIR HFA) 108 (90 Base) MCG/ACT inhaler, Inhale 2 puffs into the lungs as needed., Disp: 1 Inhaler, Rfl: 3 .  amphetamine-dextroamphetamine (ADDERALL) 5 MG tablet, Take 1 tablet (5 mg total) by mouth 2 (two) times daily with a meal., Disp: 60 tablet, Rfl: 0  Review of Systems  Social History   Tobacco Use  . Smoking status: Never Smoker  . Smokeless tobacco: Never Used  Substance Use Topics  . Alcohol use: Yes     Objective:   BP 106/70 (BP Location: Left Arm, Patient Position: Sitting, Cuff Size: Normal)   Pulse 75   Temp 98.4 F (36.9 C) (Oral)   Resp 16   Ht 5\' 1"  (1.549 m)   Wt 163 lb 3.2 oz (74 kg)   SpO2 98%   BMI 30.84 kg/m    Wt Readings from Last 3 Encounters:  04/13/19 163 lb 3.2 oz (74 kg)  03/25/19 155 lb (70.3 kg)  12/07/18 161 lb (73 kg)   Vitals:   04/13/19 0943  BP: 106/70  Pulse: 75  Resp: 16  Temp: 98.4 F (36.9 C)  TempSrc: Oral  SpO2: 98%  Weight: 163 lb 3.2 oz (74 kg)  Height: 5\' 1"  (1.549 m)   Physical Exam Constitutional:      General: She is not in acute distress.    Appearance: She is well-developed.  HENT:     Head: Normocephalic and atraumatic.     Right Ear: Hearing normal.     Left Ear: Hearing normal.     Nose: Nose normal.  Eyes:     General: Lids are normal. No scleral icterus.       Right eye: No discharge.        Left eye: No discharge.     Conjunctiva/sclera: Conjunctivae normal.  Neck:     Musculoskeletal: Neck supple.  Cardiovascular:     Rate and Rhythm: Normal rate and regular rhythm.     Pulses: Normal pulses.     Heart sounds: Normal heart sounds.  Pulmonary:     Effort: Pulmonary effort is normal. No respiratory distress.  Musculoskeletal: Normal range of motion.     Comments: Right hip scars healing well without signs of infection. Right leg approximately 3/4 cm longer than left. No limping gait or neurologic deficit.  Skin:    Findings: No lesion or rash.  Neurological:     Mental Status: She is alert and oriented to person, place, and time.  Psychiatric:        Speech: Speech normal.        Behavior: Behavior normal.        Thought Content: Thought content normal.       Assessment & Plan    1. Attention deficit disorder (ADD) without hyperactivity Tolerating Adderall 5 mg BID during school. Has decreased dosage to 1 tablet mornings and 1/2 tablet mid afternoon occasionally if she has any difficulty with insomnia. Good appetite and has gained 8 lbs since hip surgery 01-06-19 with COVID pandemic restrictions. Will check routine labs and follow up pending reports. - CBC with Differential/Platelet - Comprehensive metabolic panel - TSH  2. History of bilateral hip replacements Had right hip replacement due to degeneration since left hip replacement in the past. Will follow up with  orthopedist soon for final release.     Dortha Kernennis Chrismon, PA  [facility]

## 2019-04-14 LAB — CBC WITH DIFFERENTIAL/PLATELET
Basophils Absolute: 0.1 10*3/uL (ref 0.0–0.2)
Basos: 1 %
EOS (ABSOLUTE): 0.2 10*3/uL (ref 0.0–0.4)
Eos: 3 %
Hematocrit: 38.3 % (ref 34.0–46.6)
Hemoglobin: 12.9 g/dL (ref 11.1–15.9)
Immature Grans (Abs): 0 10*3/uL (ref 0.0–0.1)
Immature Granulocytes: 0 %
Lymphocytes Absolute: 2.6 10*3/uL (ref 0.7–3.1)
Lymphs: 36 %
MCH: 29.9 pg (ref 26.6–33.0)
MCHC: 33.7 g/dL (ref 31.5–35.7)
MCV: 89 fL (ref 79–97)
Monocytes Absolute: 0.5 10*3/uL (ref 0.1–0.9)
Monocytes: 8 %
Neutrophils Absolute: 3.8 10*3/uL (ref 1.4–7.0)
Neutrophils: 52 %
Platelets: 384 10*3/uL (ref 150–450)
RBC: 4.32 x10E6/uL (ref 3.77–5.28)
RDW: 11.9 % (ref 11.7–15.4)
WBC: 7.1 10*3/uL (ref 3.4–10.8)

## 2019-04-14 LAB — COMPREHENSIVE METABOLIC PANEL
ALT: 13 IU/L (ref 0–32)
AST: 15 IU/L (ref 0–40)
Albumin/Globulin Ratio: 2.3 — ABNORMAL HIGH (ref 1.2–2.2)
Albumin: 4.6 g/dL (ref 3.9–5.0)
Alkaline Phosphatase: 60 IU/L (ref 39–117)
BUN/Creatinine Ratio: 17 (ref 9–23)
BUN: 11 mg/dL (ref 6–20)
Bilirubin Total: 0.3 mg/dL (ref 0.0–1.2)
CO2: 23 mmol/L (ref 20–29)
Calcium: 10.2 mg/dL (ref 8.7–10.2)
Chloride: 104 mmol/L (ref 96–106)
Creatinine, Ser: 0.65 mg/dL (ref 0.57–1.00)
GFR calc Af Amer: 142 mL/min/{1.73_m2} (ref >59)
GFR calc non Af Amer: 123 mL/min/{1.73_m2} (ref >59)
Globulin, Total: 2 g/dL (ref 1.5–4.5)
Glucose: 90 mg/dL (ref 65–99)
Potassium: 4.8 mmol/L (ref 3.5–5.2)
Sodium: 141 mmol/L (ref 134–144)
Total Protein: 6.6 g/dL (ref 6.0–8.5)

## 2019-04-14 LAB — TSH: TSH: 1.65 u[IU]/mL (ref 0.450–4.500)

## 2019-04-15 DIAGNOSIS — R262 Difficulty in walking, not elsewhere classified: Secondary | ICD-10-CM | POA: Diagnosis not present

## 2019-04-15 DIAGNOSIS — M25651 Stiffness of right hip, not elsewhere classified: Secondary | ICD-10-CM | POA: Diagnosis not present

## 2019-04-15 DIAGNOSIS — Z96641 Presence of right artificial hip joint: Secondary | ICD-10-CM | POA: Diagnosis not present

## 2019-04-15 DIAGNOSIS — M25551 Pain in right hip: Secondary | ICD-10-CM | POA: Diagnosis not present

## 2019-04-15 DIAGNOSIS — R29898 Other symptoms and signs involving the musculoskeletal system: Secondary | ICD-10-CM | POA: Diagnosis not present

## 2019-04-16 ENCOUNTER — Telehealth: Payer: Self-pay

## 2019-04-16 NOTE — Telephone Encounter (Signed)
Patient notified of lab results and follow up scheduled.

## 2019-04-16 NOTE — Telephone Encounter (Signed)
-----   Message from Margo Common, [place] sent at 04/15/2019  4:42 PM EDT ----- All blood tests are normal. Follow up appointment in 3 months. May continue present medications and exercise as planned by orthopedic surgeon.

## 2019-04-29 DIAGNOSIS — Z96641 Presence of right artificial hip joint: Secondary | ICD-10-CM | POA: Diagnosis not present

## 2019-04-29 DIAGNOSIS — Z471 Aftercare following joint replacement surgery: Secondary | ICD-10-CM | POA: Diagnosis not present

## 2019-06-18 ENCOUNTER — Other Ambulatory Visit: Payer: Self-pay | Admitting: Family Medicine

## 2019-06-18 ENCOUNTER — Other Ambulatory Visit: Payer: Self-pay

## 2019-06-18 DIAGNOSIS — Z8709 Personal history of other diseases of the respiratory system: Secondary | ICD-10-CM

## 2019-06-18 MED ORDER — ALBUTEROL SULFATE HFA 108 (90 BASE) MCG/ACT IN AERS: 2.0000 | INHALATION_SPRAY | RESPIRATORY_TRACT | 3 refills | Status: DC | PRN

## 2019-06-18 NOTE — Telephone Encounter (Signed)
Medication request for ProAir inhaler.

## 2019-06-18 NOTE — Telephone Encounter (Signed)
Patient called requesting refill again. [facility]. She needs it before the weekend. Thanks!

## 2019-06-24 DIAGNOSIS — M9904 Segmental and somatic dysfunction of sacral region: Secondary | ICD-10-CM | POA: Diagnosis not present

## 2019-06-24 DIAGNOSIS — M545 Low back pain: Secondary | ICD-10-CM | POA: Diagnosis not present

## 2019-06-24 DIAGNOSIS — M9903 Segmental and somatic dysfunction of lumbar region: Secondary | ICD-10-CM | POA: Diagnosis not present

## 2019-06-24 DIAGNOSIS — M9905 Segmental and somatic dysfunction of pelvic region: Secondary | ICD-10-CM | POA: Diagnosis not present

## 2019-07-08 DIAGNOSIS — M545 Low back pain: Secondary | ICD-10-CM | POA: Diagnosis not present

## 2019-07-08 DIAGNOSIS — M9903 Segmental and somatic dysfunction of lumbar region: Secondary | ICD-10-CM | POA: Diagnosis not present

## 2019-07-08 DIAGNOSIS — M9905 Segmental and somatic dysfunction of pelvic region: Secondary | ICD-10-CM | POA: Diagnosis not present

## 2019-07-08 DIAGNOSIS — M9904 Segmental and somatic dysfunction of sacral region: Secondary | ICD-10-CM | POA: Diagnosis not present

## 2019-07-16 ENCOUNTER — Ambulatory Visit: Payer: Self-pay | Admitting: Family Medicine

## 2019-07-16 NOTE — Progress Notes (Deleted)
   {  Method of visit:23308}  Patient: Lauren Weber Female    DOB: 1990/09/26   28 y.o.   MRN: [id number] Visit Date: 07/16/2019  Today's Provider: Vernie Murders, PA   No chief complaint on file.  Subjective:     HPI  Attention deficit disorder (ADD) without hyperactivity Tolerating Adderall 5 mg BID during school. Has decreased dosage to 1 tablet mornings and 1/2 tablet mid afternoon occasionally if she has any difficulty with insomnia. Good appetite and has gained 8 lbs since hip surgery 01-06-19 with COVID pandemic restrictions. Will check routine labs and follow up pending reports. - CBC with Differential/Platelet - Comprehensive metabolic panel - TSH  History of bilateral hip replacements Had right hip replacement due to degeneration since left hip replacement in the past. Will follow up with orthopedist soon for final release.   Allergies  Allergen Reactions  . Nickel     Other reaction(s): Contact Dermatitis (intolerance) Ear to redden and hurt(rash?)  . Amoxicillin Rash     Current Outpatient Medications:  .  acetaminophen (TYLENOL) 650 MG CR tablet, Take 650 mg by mouth every 8 (eight) hours as needed for pain., Disp: , Rfl:  .  albuterol (PROAIR HFA) 108 (90 Base) MCG/ACT inhaler, Inhale 2 puffs into the lungs as needed., Disp: 8 g, Rfl: 3 .  amphetamine-dextroamphetamine (ADDERALL) 5 MG tablet, Take 1 tablet (5 mg total) by mouth 2 (two) times daily with a meal., Disp: 60 tablet, Rfl: 0  Review of Systems  Social History   Tobacco Use  . Smoking status: Never Smoker  . Smokeless tobacco: Never Used  Substance Use Topics  . Alcohol use: Yes      Objective:   There were no vitals taken for this visit. There were no vitals filed for this visit.There is no height or weight on file to calculate BMI.   Physical Exam   No results found for any visits on 07/16/19.     Assessment & Cockrell Hill, PA  [facility]

## 2019-11-22 ENCOUNTER — Telehealth: Payer: Self-pay

## 2019-11-22 NOTE — Telephone Encounter (Signed)
Lauren Weber is out of the office, please advise.

## 2019-11-23 DIAGNOSIS — M542 Cervicalgia: Secondary | ICD-10-CM | POA: Diagnosis not present

## 2019-11-23 DIAGNOSIS — M9902 Segmental and somatic dysfunction of thoracic region: Secondary | ICD-10-CM | POA: Diagnosis not present

## 2019-11-23 DIAGNOSIS — M5416 Radiculopathy, lumbar region: Secondary | ICD-10-CM | POA: Diagnosis not present

## 2019-11-23 DIAGNOSIS — M546 Pain in thoracic spine: Secondary | ICD-10-CM | POA: Diagnosis not present

## 2019-11-30 NOTE — Telephone Encounter (Signed)
Pt has called a few times wanting to speak with someone and hasn't had a call back . Pt states the call is in reference to left shoulder inflamed again and wanted to speak with her dr about issue.   Please advise   Call back [phone]

## 2019-11-30 NOTE — Telephone Encounter (Signed)
Patient requesting call back, states that she has never heard from office.

## 2019-11-30 NOTE — Telephone Encounter (Signed)
Do you know what this is about? 

## 2019-12-01 ENCOUNTER — Ambulatory Visit (INDEPENDENT_AMBULATORY_CARE_PROVIDER_SITE_OTHER): Payer: 59 | Admitting: Family Medicine

## 2019-12-01 ENCOUNTER — Encounter: Payer: Self-pay | Admitting: Family Medicine

## 2019-12-01 DIAGNOSIS — M9902 Segmental and somatic dysfunction of thoracic region: Secondary | ICD-10-CM | POA: Diagnosis not present

## 2019-12-01 DIAGNOSIS — M542 Cervicalgia: Secondary | ICD-10-CM | POA: Diagnosis not present

## 2019-12-01 DIAGNOSIS — M5416 Radiculopathy, lumbar region: Secondary | ICD-10-CM | POA: Diagnosis not present

## 2019-12-01 DIAGNOSIS — S46912A Strain of unspecified muscle, fascia and tendon at shoulder and upper arm level, left arm, initial encounter: Secondary | ICD-10-CM | POA: Diagnosis not present

## 2019-12-01 DIAGNOSIS — M153 Secondary multiple arthritis: Secondary | ICD-10-CM | POA: Diagnosis not present

## 2019-12-01 DIAGNOSIS — M546 Pain in thoracic spine: Secondary | ICD-10-CM | POA: Diagnosis not present

## 2019-12-01 MED ORDER — PREDNISONE 10 MG PO TABS: ORAL_TABLET | ORAL | 0 refills | Status: AC
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
## 2019-12-01 NOTE — Telephone Encounter (Signed)
Appointment has been made for patient 

## 2019-12-01 NOTE — Patient Instructions (Signed)
.   Please review the attached list of medications and notify my office if there are any errors.   . Please bring all of your medications to every appointment so we can make sure that our medication list is the same as yours.   

## 2020-01-17 ENCOUNTER — Encounter: Payer: Self-pay | Admitting: Family Medicine

## 2020-01-17 ENCOUNTER — Ambulatory Visit (INDEPENDENT_AMBULATORY_CARE_PROVIDER_SITE_OTHER): Payer: 59 | Admitting: Family Medicine

## 2020-01-17 ENCOUNTER — Other Ambulatory Visit: Payer: Self-pay

## 2020-01-17 VITALS — BP 110/68 | HR 82 | Temp 99.6°F | Wt 159.0 lb

## 2020-01-17 DIAGNOSIS — M25512 Pain in left shoulder: Secondary | ICD-10-CM | POA: Diagnosis not present

## 2020-01-17 DIAGNOSIS — M542 Cervicalgia: Secondary | ICD-10-CM | POA: Diagnosis not present

## 2020-01-17 DIAGNOSIS — M546 Pain in thoracic spine: Secondary | ICD-10-CM | POA: Diagnosis not present

## 2020-01-17 DIAGNOSIS — M5416 Radiculopathy, lumbar region: Secondary | ICD-10-CM | POA: Diagnosis not present

## 2020-01-17 DIAGNOSIS — M9902 Segmental and somatic dysfunction of thoracic region: Secondary | ICD-10-CM | POA: Diagnosis not present

## 2020-01-17 MED ORDER — METHOCARBAMOL 500 MG PO TABS: 500.0000 mg | ORAL_TABLET | Freq: Four times a day (QID) | ORAL | 1 refills | Status: DC

## 2020-01-17 NOTE — Patient Instructions (Signed)
Shoulder Range of Motion Exercises Shoulder range of motion (ROM) exercises are done to keep the shoulder moving freely or to increase movement. They are often recommended for people who have shoulder pain or stiffness or who are recovering from a shoulder surgery. Phase 1 exercises When you are able, do this exercise 1-2 times per day for 30-60 seconds in each direction, or as directed by your health care provider. Pendulum exercise To do this exercise while sitting: 1. Sit in a chair or at the edge of your bed with your feet flat on the floor. 2. Let your affected arm hang down in front of you over the edge of the bed or chair. 3. Relax your shoulder, arm, and hand. 4. Rock your body so your arm gently swings in small circles. You can also use your unaffected arm to start the motion. 5. Repeat changing the direction of the circles, swinging your arm left and right, and swinging your arm forward and back. To do this exercise while standing: 1. Stand next to a sturdy chair or table, and hold on to it with your hand on your unaffected side. 2. Bend forward at the waist. 3. Bend your knees slightly. 4. Relax your shoulder, arm, and hand. 5. While keeping your shoulder relaxed, use body motion to swing your arm in small circles. 6. Repeat changing the direction of the circles, swinging your arm left and right, and swinging your arm forward and back. 7. Between exercises, stand up tall and take a short break to relax your lower back.  Phase 2 exercises Do these exercises 1-2 times per day or as told by your health care provider. Hold each stretch for 30 seconds, and repeat 3 times. Do the exercises with one or both arms as instructed by your health care provider. For these exercises, sit at a table with your hand and arm supported by the table. A chair that slides easily or has wheels can be helpful. External rotation 1. Turn your chair so that your affected side is nearest to the  table. 2. Place your forearm on the table to your side. Bend your elbow about 90 at the elbow (right angle) and place your hand palm facing down on the table. Your elbow should be about 6 inches away from your side. 3. Keeping your arm on the table, lean your body forward. Abduction 1. Turn your chair so that your affected side is nearest to the table. 2. Place your forearm and hand on the table so that your thumb points toward the ceiling and your arm is straight out to your side. 3. Slide your hand out to the side and away from you, using your unaffected arm to do the work. 4. To increase the stretch, you can slide your chair away from the table. Flexion: forward stretch 1. Sit facing the table. Place your hand and elbow on the table in front of you. 2. Slide your hand forward and away from you, using your unaffected arm to do the work. 3. To increase the stretch, you can slide your chair backward. Phase 3 exercises Do these exercises 1-2 times per day or as told by your health care provider. Hold each stretch for 30 seconds, and repeat 3 times. Do the exercises with one or both arms as instructed by your health care provider. Cross-body stretch: posterior capsule stretch 1. Lift your arm straight out in front of you. 2. Bend your arm 90 at the elbow (right angle) so your forearm   moves across your body. 3. Use your other arm to gently pull the elbow across your body, toward your other shoulder. Wall climbs 1. Stand with your affected arm extended out to the side with your hand resting on a door frame. 2. Slide your hand slowly up the door frame. 3. To increase the stretch, step through the door frame. Keep your body upright and do not lean. Wand exercises You will need a cane, a piece of PVC pipe, or a sturdy wooden dowel for wand exercises. Flexion To do this exercise while standing: 1. Hold the wand with both of your hands, palms down. 2. Using the other arm to help, lift your arms  up and over your head, if able. 3. Push upward with your other arm to gently increase the stretch. To do this exercise while lying down: 1. Lie on your back with your elbows resting on the floor and the wand in both your hands. Your hands will be palm down, or pointing toward your feet. 2. Lift your hands toward the ceiling, using your unaffected arm to help if needed. 3. Bring your arms overhead as able, using your unaffected arm to help if needed. Internal rotation 1. Stand while holding the wand behind you with both hands. Your unaffected arm should be extended above your head with the arm of the affected side extended behind you at the level of your waist. The wand should be pointing straight up and down as you hold it. 2. Slowly pull the wand up behind your back by straightening the elbow of your unaffected arm and bending the elbow of your affected arm. External rotation 1. Lie on your back with your affected upper arm supported on a small pillow or rolled towel. When you first do this exercise, keep your upper arm close to your body. Over time, bring your arm up to a 90 angle out to the side. 2. Hold the wand across your stomach and with both hands palm up. Your elbow on your affected side should be bent at a 90 angle. 3. Use your unaffected side to help push your forearm away from you and toward the floor. Keep your elbow on your affected side bent at a 90 angle. Contact a health care provider if you have:  New or increasing pain.  New numbness, tingling, weakness, or discoloration in your arm or hand. This information is not intended to replace advice given to you by your health care provider. Make sure you discuss any questions you have with your health care provider. Document Revised: 10/01/2017 Document Reviewed: 10/01/2017 [employer] Patient Education  2020 [employer]  
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
## 2020-01-20 DIAGNOSIS — Z471 Aftercare following joint replacement surgery: Secondary | ICD-10-CM | POA: Diagnosis not present

## 2020-01-20 DIAGNOSIS — Z96643 Presence of artificial hip joint, bilateral: Secondary | ICD-10-CM | POA: Diagnosis not present

## 2020-01-20 DIAGNOSIS — Z96641 Presence of right artificial hip joint: Secondary | ICD-10-CM | POA: Diagnosis not present

## 2020-01-20 DIAGNOSIS — Z96642 Presence of left artificial hip joint: Secondary | ICD-10-CM | POA: Diagnosis not present

## 2020-01-28 ENCOUNTER — Other Ambulatory Visit: Payer: Self-pay

## 2020-01-28 ENCOUNTER — Encounter: Payer: Self-pay | Admitting: Family Medicine

## 2020-01-28 ENCOUNTER — Ambulatory Visit (INDEPENDENT_AMBULATORY_CARE_PROVIDER_SITE_OTHER): Payer: 59 | Admitting: Family Medicine

## 2020-01-28 ENCOUNTER — Ambulatory Visit: Payer: Self-pay

## 2020-01-28 VITALS — BP 102/60 | HR 65 | Temp 97.6°F | Wt 155.0 lb

## 2020-01-28 DIAGNOSIS — Z3046 Encounter for surveillance of implantable subdermal contraceptive: Secondary | ICD-10-CM

## 2020-01-28 DIAGNOSIS — N926 Irregular menstruation, unspecified: Secondary | ICD-10-CM | POA: Diagnosis not present

## 2020-01-28 DIAGNOSIS — Z30011 Encounter for initial prescription of contraceptive pills: Secondary | ICD-10-CM | POA: Diagnosis not present

## 2020-01-28 MED ORDER — NORETHINDRONE ACET-ETHINYL EST 1.5-30 MG-MCG PO TABS: 1.0000 | ORAL_TABLET | Freq: Every day | ORAL | 11 refills | Status: DC

## 2020-01-28 NOTE — Progress Notes (Signed)
Established patient visit   Patient: Lauren Weber   DOB: [birth year]   29 y.o. Female  MRN: [id number] Visit Date: 01/28/2020  Today's healthcare provider: Shirlee Latch, MD   Chief Complaint  Patient presents with  . Contraception   Subjective    HPI  Nexplenon Removal and Contraception Counciling   The patient currently has a nexplanon in her left arm Pt complains of mood side effects including increased depressed mood and anxiety Pt also complains of irregular and heavy bleeding during periods Last month pt had 3 periods that included a cycle of 7 days heavy bleeding and a cycle of spotty bleeding Other side effects include cramping Pt wants nexplenon removal and to discuss other contraceptive options Pt is hesitant about IUD given she has never been sexually active    ------------------------------------------------------------------------------------     Social History   Tobacco Use  . Smoking status: Never Smoker  . Smokeless tobacco: Never Used  Substance Use Topics  . Alcohol use: Yes  . Drug use: No   Social History   Socioeconomic History  . Marital status: Single    Spouse name: Not on file  . Number of children: Not on file  . Years of education: Not on file  . Highest education level: Not on file  Occupational History  . Not on file  Tobacco Use  . Smoking status: Never Smoker  . Smokeless tobacco: Never Used  Substance and Sexual Activity  . Alcohol use: Yes  . Drug use: No  . Sexual activity: Not on file  Other Topics Concern  . Not on file  Social History Narrative  . Not on file   Social Determinants of Health   Financial Resource Strain:   . Difficulty of Paying Living Expenses:   Food Insecurity:   . Worried About Programme researcher, broadcasting/film/video in the Last Year:   . Barista in the Last Year:   Transportation Needs:   . Freight forwarder (Medical):   Marland Kitchen Lack of Transportation (Non-Medical):   Physical Activity:    . Days of Exercise per Week:   . Minutes of Exercise per Session:   Stress:   . Feeling of Stress :   Social Connections:   . Frequency of Communication with Friends and Family:   . Frequency of Social Gatherings with Friends and Family:   . Attends Religious Services:   . Active Member of Clubs or Organizations:   . Attends Banker Meetings:   Marland Kitchen Marital Status:   Intimate Partner Violence:   . Fear of Current or Ex-Partner:   . Emotionally Abused:   Marland Kitchen Physically Abused:   . Sexually Abused:        Medications: Outpatient Medications Prior to Visit  Medication Sig  . acetaminophen (TYLENOL) 650 MG CR tablet Take 650 mg by mouth every 8 (eight) hours as needed for pain.  Marland Kitchen albuterol (PROAIR HFA) 108 (90 Base) MCG/ACT inhaler Inhale 2 puffs into the lungs as needed.  . methocarbamol (ROBAXIN) 500 MG tablet Take 1 tablet (500 mg total) by mouth 4 (four) times daily.   No facility-administered medications prior to visit.    Review of Systems  Constitutional: Negative.   HENT: Negative.   Eyes: Negative.   Respiratory: Negative.   Cardiovascular: Negative.   Gastrointestinal: Negative.   Endocrine: Negative.   Genitourinary: Positive for menstrual problem.  Musculoskeletal: Negative.   Skin: Negative.   Allergic/Immunologic: Negative.  Neurological: Negative.   Hematological: Negative.   Psychiatric/Behavioral: Negative.     Last CBC Lab Results  Component Value Date   WBC 7.1 04/13/2019   HGB 12.9 04/13/2019   HCT 38.3 04/13/2019   MCV 89 04/13/2019   MCH 29.9 04/13/2019   RDW 11.9 04/13/2019   PLT 384 95/28/4132   Last metabolic panel Lab Results  Component Value Date   GLUCOSE 90 04/13/2019   NA 141 04/13/2019   K 4.8 04/13/2019   CL 104 04/13/2019   CO2 23 04/13/2019   BUN 11 04/13/2019   CREATININE 0.65 04/13/2019   GFRNONAA 123 04/13/2019   GFRAA 142 04/13/2019   CALCIUM 10.2 04/13/2019   PROT 6.6 04/13/2019   ALBUMIN 4.6  04/13/2019   LABGLOB 2.0 04/13/2019   AGRATIO 2.3 (H) 04/13/2019   BILITOT 0.3 04/13/2019   ALKPHOS 60 04/13/2019   AST 15 04/13/2019   ALT 13 04/13/2019   Last lipids Lab Results  Component Value Date   CHOL 177 11/10/2017   HDL 76 11/10/2017   LDLCALC 92 11/10/2017   TRIG 43 11/10/2017   CHOLHDL 2.3 11/10/2017      Objective    BP 102/60 (BP Location: Right Arm, Patient Position: Sitting, Cuff Size: Normal)   Pulse 65   Temp 97.6 F (36.4 C) (Temporal)   Wt 155 lb (70.3 kg)   SpO2 99%   BMI 29.29 kg/m  BP Readings from Last 3 Encounters:  01/28/20 102/60  01/17/20 110/68  04/13/19 106/70   Wt Readings from Last 3 Encounters:  01/28/20 155 lb (70.3 kg)  01/17/20 159 lb (72.1 kg)  04/13/19 163 lb 3.2 oz (74 kg)      Physical Exam Constitutional:      Appearance: Normal appearance.  HENT:     Head: Normocephalic and atraumatic.  Cardiovascular:     Rate and Rhythm: Normal rate and regular rhythm.     Pulses: Normal pulses.     Heart sounds: Normal heart sounds.  Pulmonary:     Effort: Pulmonary effort is normal.     Breath sounds: Normal breath sounds.  Musculoskeletal:     Cervical back: Normal range of motion and neck supple.     Right lower leg: No edema.     Left lower leg: No edema.  Skin:    General: Skin is warm and dry.     Capillary Refill: Capillary refill takes less than 2 seconds.  Neurological:     General: No focal deficit present.     Mental Status: She is alert and oriented to person, place, and time.  Psychiatric:        Mood and Affect: Mood normal.        Behavior: Behavior normal.     No results found for any visits on 01/28/20.  Assessment & Plan     1. Encounter for Nexplanon removal / Irregular menstrual bleeding / Encounter for initial prescription of contraceptive pills Pt currently with a nexplanon and side effects mood changes and irregular menstrual cycles Pt is interested in alternative forms of contraception Pt's  main concerns are for regular menstrual cycles and stabilized mood After shared decision making, pt would like to start on a estrogen-progesterone OCP  Plan: Will remove nexplanon Will start on Junel OCP    Return in about 4 weeks (around 02/25/2020) for CPE.      Chipper Herb, Medical Student [facility] [phone] (phone) [phone] (fax)  [facility]  

## 2020-01-28 NOTE — Telephone Encounter (Signed)
She may want to take off the pressure bandage too.  If it is tight, it may be compressing

## 2020-01-28 NOTE — Progress Notes (Signed)
PROCEDURE NOTE: NEXPLANON REMOVAL  Patient given informed consent and signed copy in the chart. An appropriate time out was been taken L  arm area prepped and draped in the usual sterile fashion. Three cc of 1% lidocaine without epinephrine used for local anesthesia. A small stab incision was made close to the nexplanon with scalpel. Hemostats were used to withdraw the Nexplanon.   Pt insertion site covered with steri-strip and pressure dressing.   Minimal blood loss.  Pt tolerated the procedure well.    See note from Terrill Mohr, medical student, attested by me from same date of service for OV information  Ruthella Kirchman, Marzella Schlein, MD, MPH [facility]

## 2020-01-28 NOTE — Telephone Encounter (Signed)
Pt. Reports she was just in the office and had her Nexplanon removed. Is having some tingling in left hand and some puffiness. Warm transfer to [facility] in the practice.  Answer Assessment - Initial Assessment Questions 1. SYMPTOM: "What is the main symptom you are concerned about?" (e.g., weakness, numbness)     Tingling left hand 2. ONSET: "When did this start?" (minutes, hours, days; while sleeping)     Just a little while ago 3. LAST NORMAL: "When was the last time you were normal (no symptoms)?"     This afternoon 4. PATTERN "Does this come and go, or has it been constant since it started?"  "Is it present now?"     Constant 5. CARDIAC SYMPTOMS: "Have you had any of the following symptoms: chest pain, difficulty breathing, palpitations?"     No 6. NEUROLOGIC SYMPTOMS: "Have you had any of the following symptoms: headache, dizziness, vision loss, double vision, changes in speech, unsteady on your feet?"     No 7. OTHER SYMPTOMS: "Do you have any other symptoms?"     Left hand is puffy 8. PREGNANCY: "Is there any chance you are pregnant?" "When was your last menstrual period?"     No  Protocols used: NEUROLOGIC DEFICIT-A-AH

## 2020-01-28 NOTE — Telephone Encounter (Signed)
I spoke with Cleva she denied any other symptoms.  She states it just feel like her hand was asleep and has the "Pins and Needles" feeling.  I advised her that was normal as the lidocaine wears off.  I also advised her that is she experiences new or worsening symptom to be evaluated at an Urgent Care or ED.    Thanks,   -Vernona Rieger

## 2020-01-28 NOTE — Patient Instructions (Signed)

## 2020-01-28 NOTE — Telephone Encounter (Signed)
Pt advised.   Thanks,   -Lauren Weber  

## 2020-02-25 ENCOUNTER — Encounter: Payer: 59 | Admitting: Family Medicine

## 2020-03-03 ENCOUNTER — Encounter: Payer: Self-pay | Admitting: Family Medicine

## 2020-03-03 ENCOUNTER — Ambulatory Visit (INDEPENDENT_AMBULATORY_CARE_PROVIDER_SITE_OTHER): Payer: 59 | Admitting: Family Medicine

## 2020-03-03 ENCOUNTER — Telehealth: Payer: Self-pay | Admitting: Family Medicine

## 2020-03-03 ENCOUNTER — Other Ambulatory Visit: Payer: Self-pay

## 2020-03-03 ENCOUNTER — Other Ambulatory Visit: Payer: Self-pay | Admitting: Family Medicine

## 2020-03-03 VITALS — BP 114/68 | HR 68 | Temp 96.9°F | Wt 159.0 lb

## 2020-03-03 DIAGNOSIS — F988 Other specified behavioral and emotional disorders with onset usually occurring in childhood and adolescence: Secondary | ICD-10-CM

## 2020-03-03 MED ORDER — ATOMOXETINE HCL 25 MG PO CAPS: ORAL_CAPSULE | ORAL | 0 refills | Status: DC

## 2020-03-03 MED ORDER — BUPROPION HCL ER (SR) 150 MG PO TB12: ORAL_TABLET | ORAL | 0 refills | Status: DC

## 2020-03-03 NOTE — Progress Notes (Signed)
Changed prescription for Strattera to Wellbutrin due to cost at patient's request.

## 2020-03-03 NOTE — Telephone Encounter (Signed)
Sent an alternative to her pharmacy.
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
## 2020-03-03 NOTE — Telephone Encounter (Signed)
Pt states that atomoxetine (STRATTERA) 25 MG capsule is going to be over $80 a month and she can not afford.  Wants to know if there is something different she can take.

## 2020-03-03 NOTE — Patient Instructions (Signed)

## 2020-03-08 ENCOUNTER — Telehealth: Payer: Self-pay | Admitting: Family Medicine

## 2020-03-08 NOTE — Telephone Encounter (Signed)
Pt called in for assistance. Pt was prescribed buPROPion (WELLBUTRIN SR) 150 MG 12 hr tablet , pt says even with her insurance medication is to expensive. Pt would like to know if there is something different that the provider could prescribe?   Please advise.

## 2020-03-09 ENCOUNTER — Other Ambulatory Visit: Payer: Self-pay | Admitting: Family Medicine

## 2020-03-09 MED ORDER — BUPROPION HCL 75 MG PO TABS: 75.0000 mg | ORAL_TABLET | Freq: Two times a day (BID) | ORAL | 3 refills | Status: DC

## 2020-03-09 NOTE — Telephone Encounter (Signed)
Patient called toinform Dr Shella Spearing that the Rx sent to the pharmacy for her is too expensive and she cant afford it. States that she have called back been waiting to hear something or for him to send another Rx to the pharmacy for her that may be a bit more cost effective after the insurance pays its part but $85 is too much for her. Please call patient at  Ph# [phone]

## 2020-03-09 NOTE — Telephone Encounter (Signed)
Patient advised.

## 2020-03-09 NOTE — Telephone Encounter (Signed)
Changed this to the regular dose instead of the extended release form. Insurance formulary online says this should be a Tier I (lowest cost) drug.

## 2020-03-15 IMAGING — CR LEFT SHOULDER - 2+ VIEW
1 series · 3 of 3 positions shown · non-contrast
Comparison: None.

CLINICAL DATA: Intermittent left shoulder pain for several months,
worse over the past 2 weeks.

EXAM:
LEFT SHOULDER - 2+ VIEW

[Series 1: dg shoulder left · 0.14mm/px · 3 of 3 slices shown]
[im 1/3]
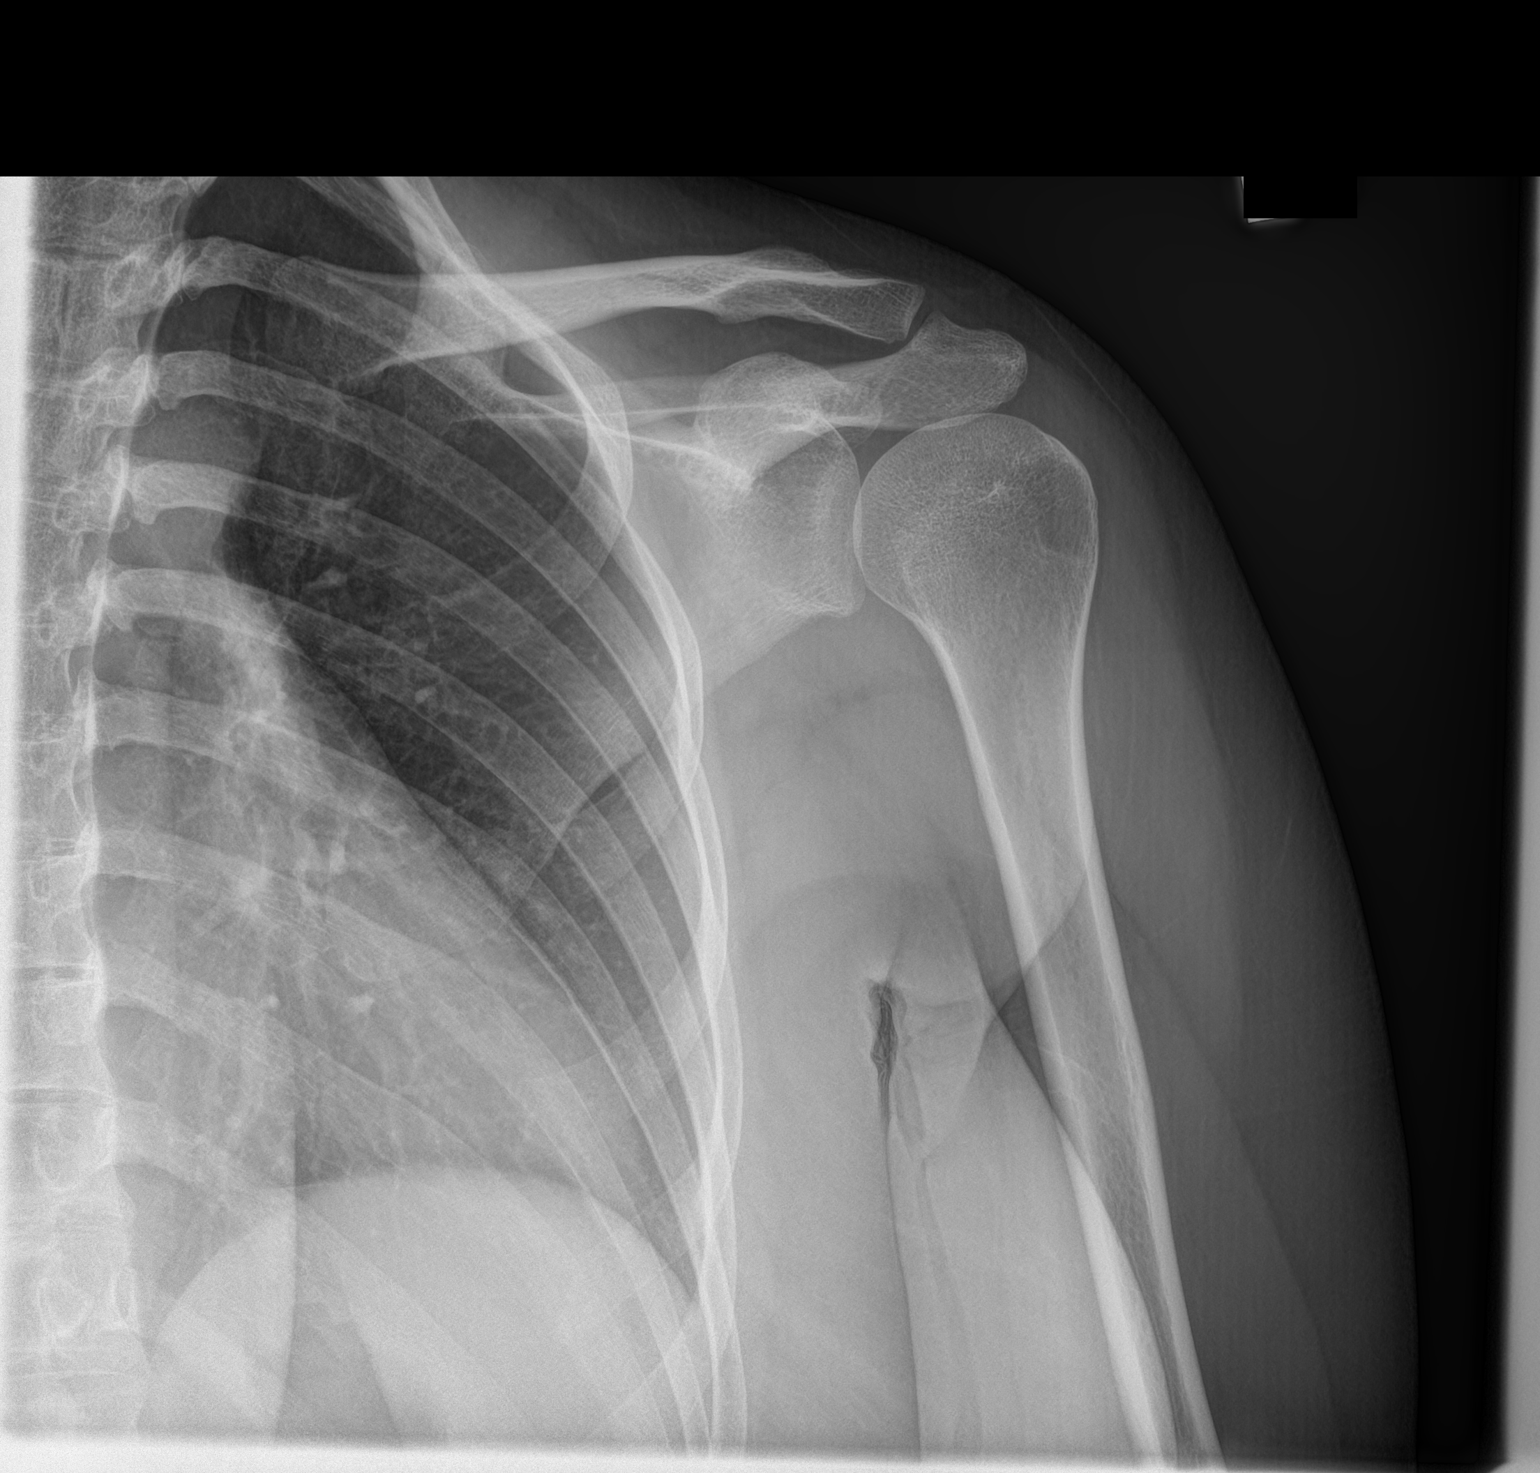
[im 2/3]
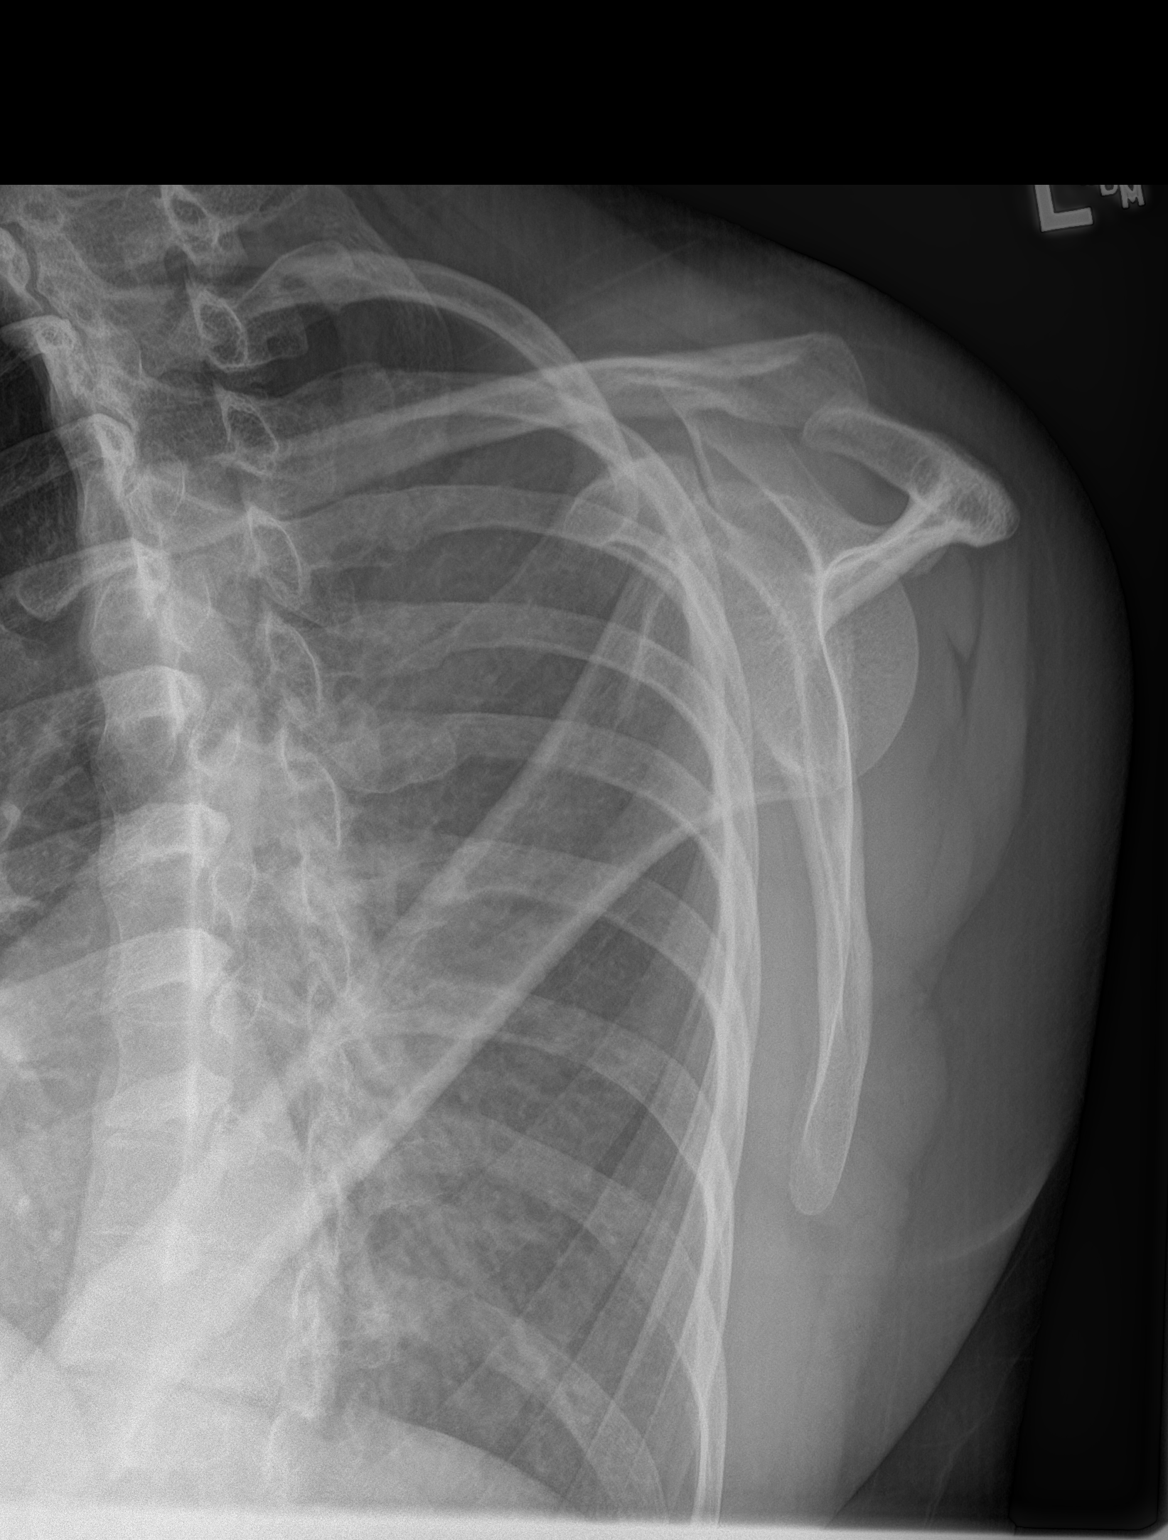
[im 3/3]
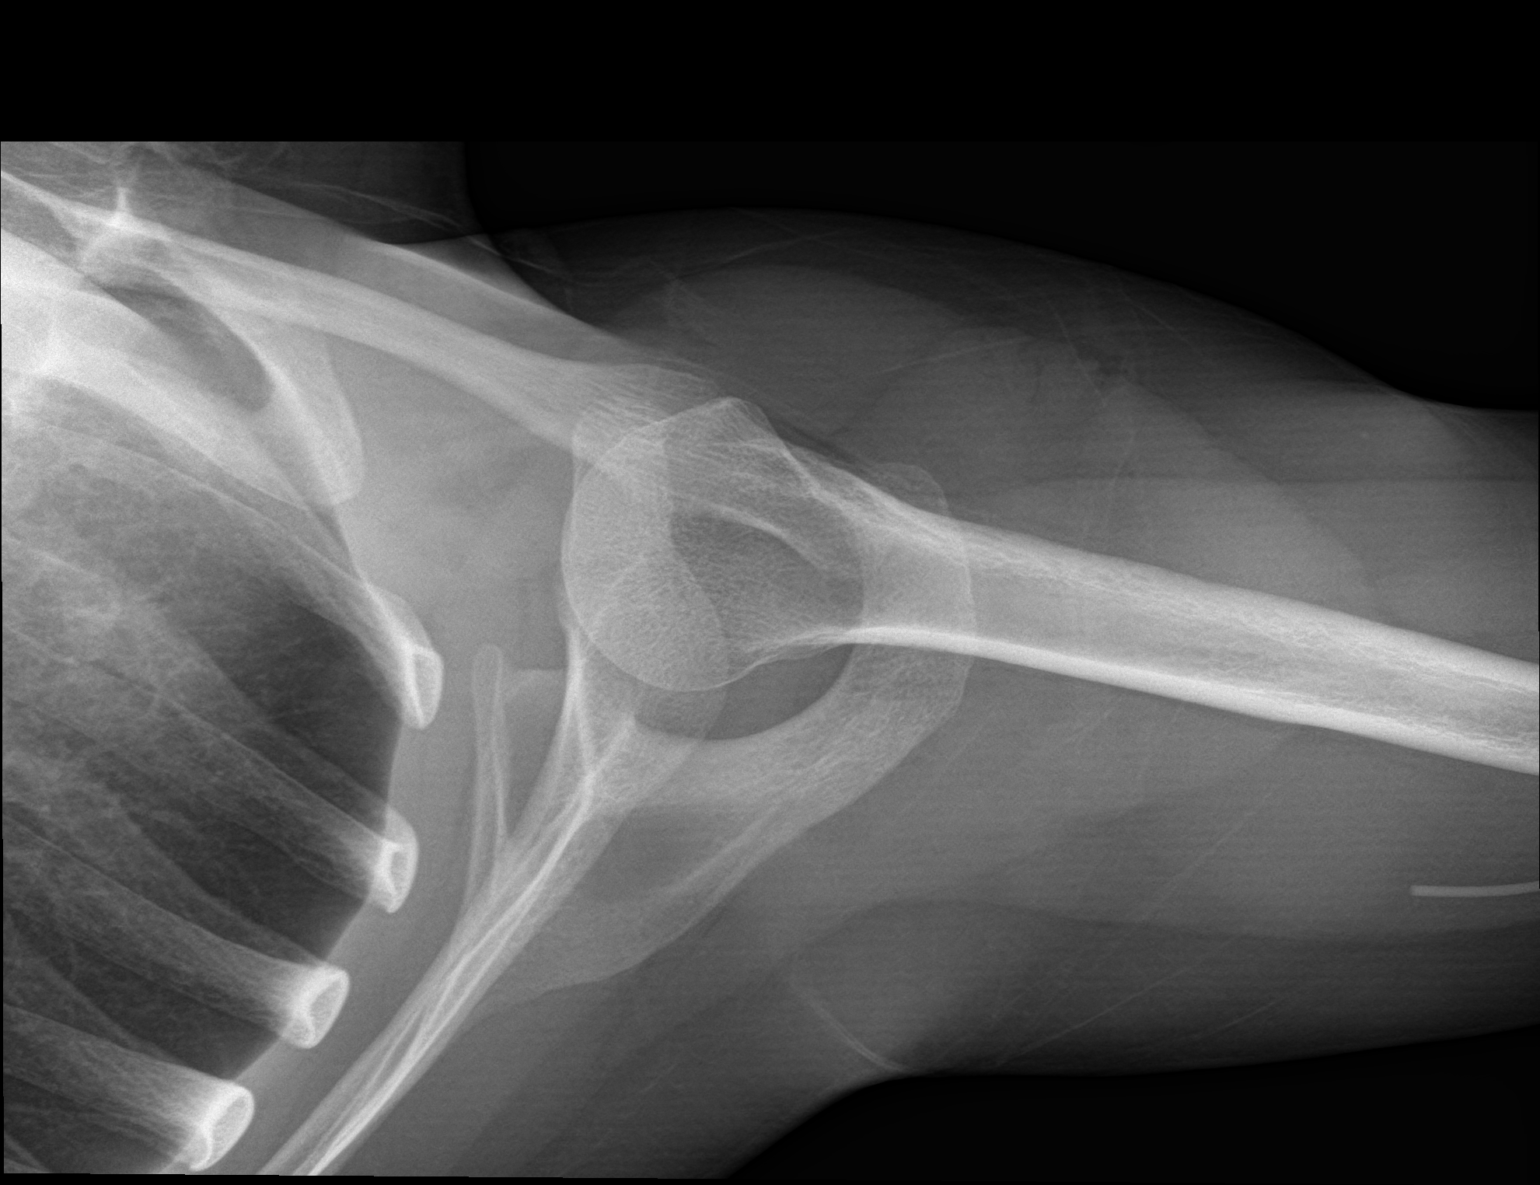

[3 of 3 positions shown; findings below may reference images not displayed]

FINDINGS: There is no evidence of fracture or dislocation. There is no
evidence of arthropathy or other focal bone abnormality. Soft
tissues are unremarkable.
IMPRESSION: Normal exam.

## 2020-03-15 IMAGING — CR CERVICAL SPINE - COMPLETE 4+ VIEW
1 series · 6 of 6 positions shown · non-contrast
Comparison: None.

CLINICAL DATA: Intermittent neck pain for several months. The
patient feels a knot over the lower cervical spine. No known injury.

EXAM:
CERVICAL SPINE - COMPLETE 4+ VIEW

[Series 1: dg cervical spine complete · 0.14mm/px · 6 of 6 slices shown]
[im 1/6]
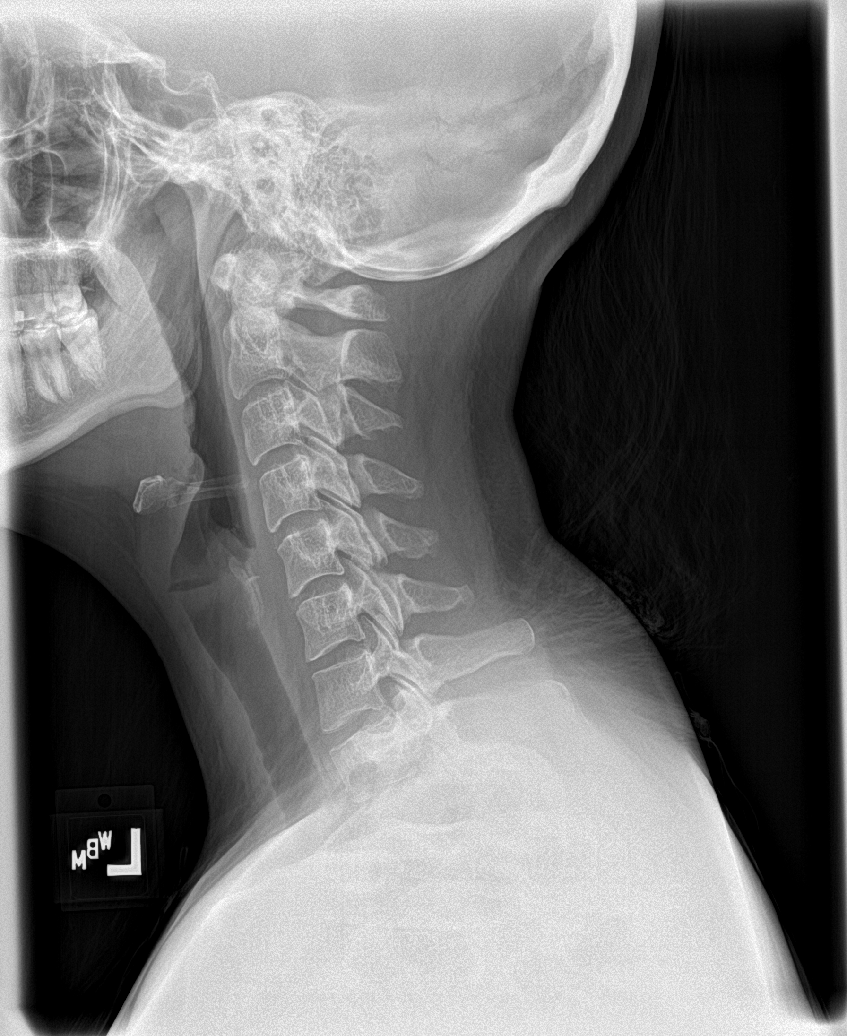
[im 2/6]
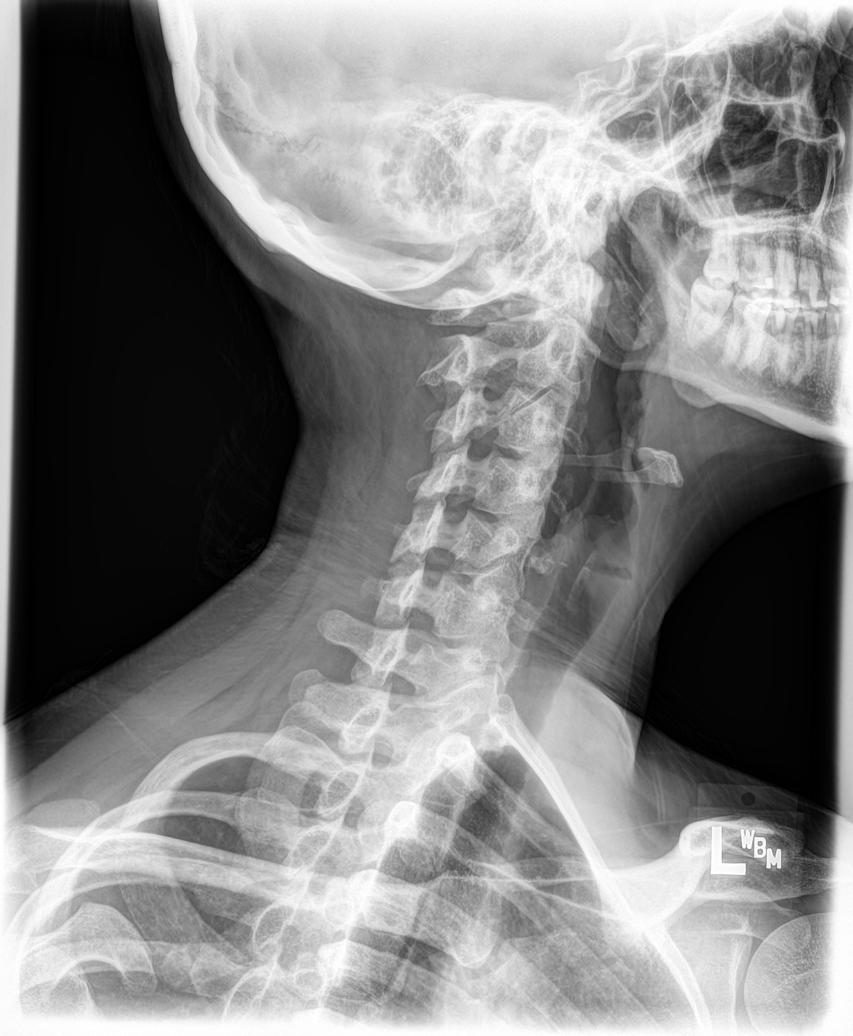
[im 3/6]
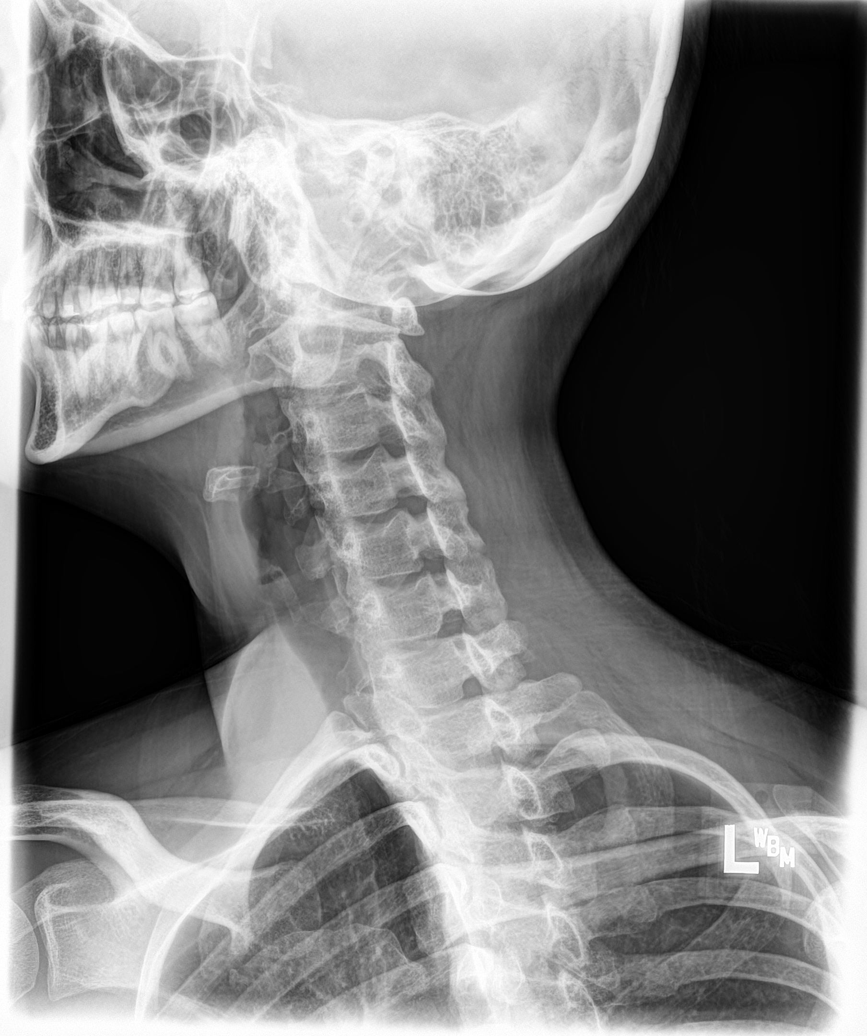
[im 4/6]
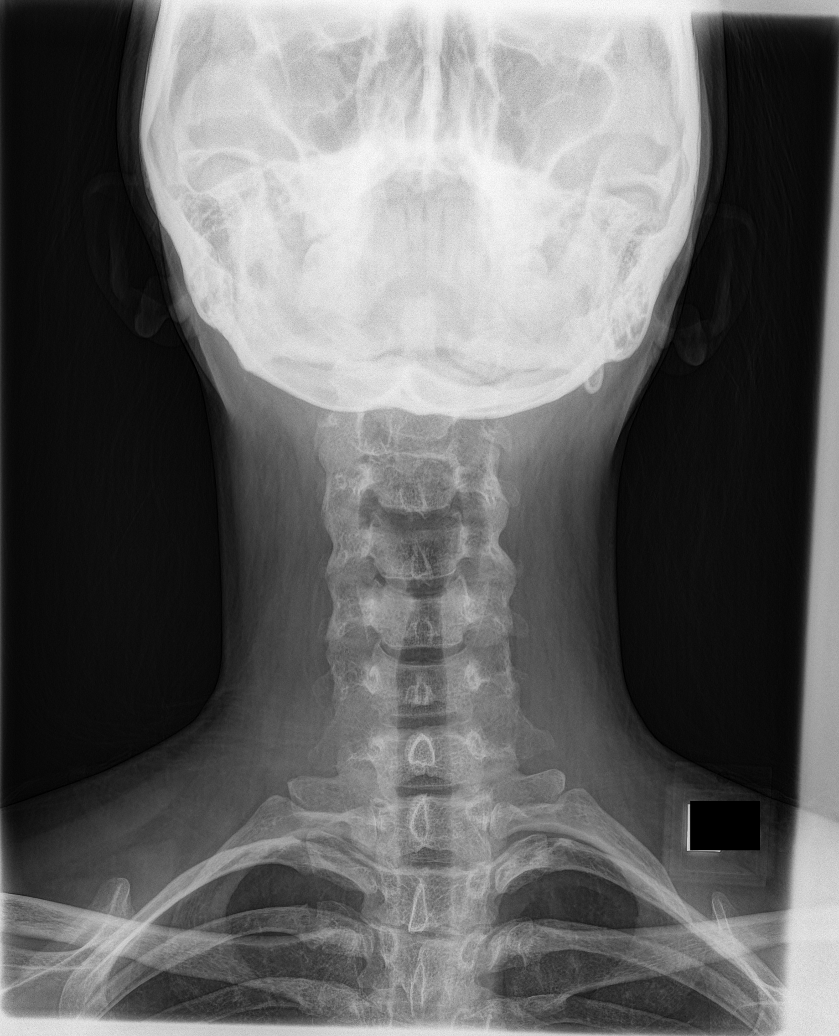
[im 5/6]
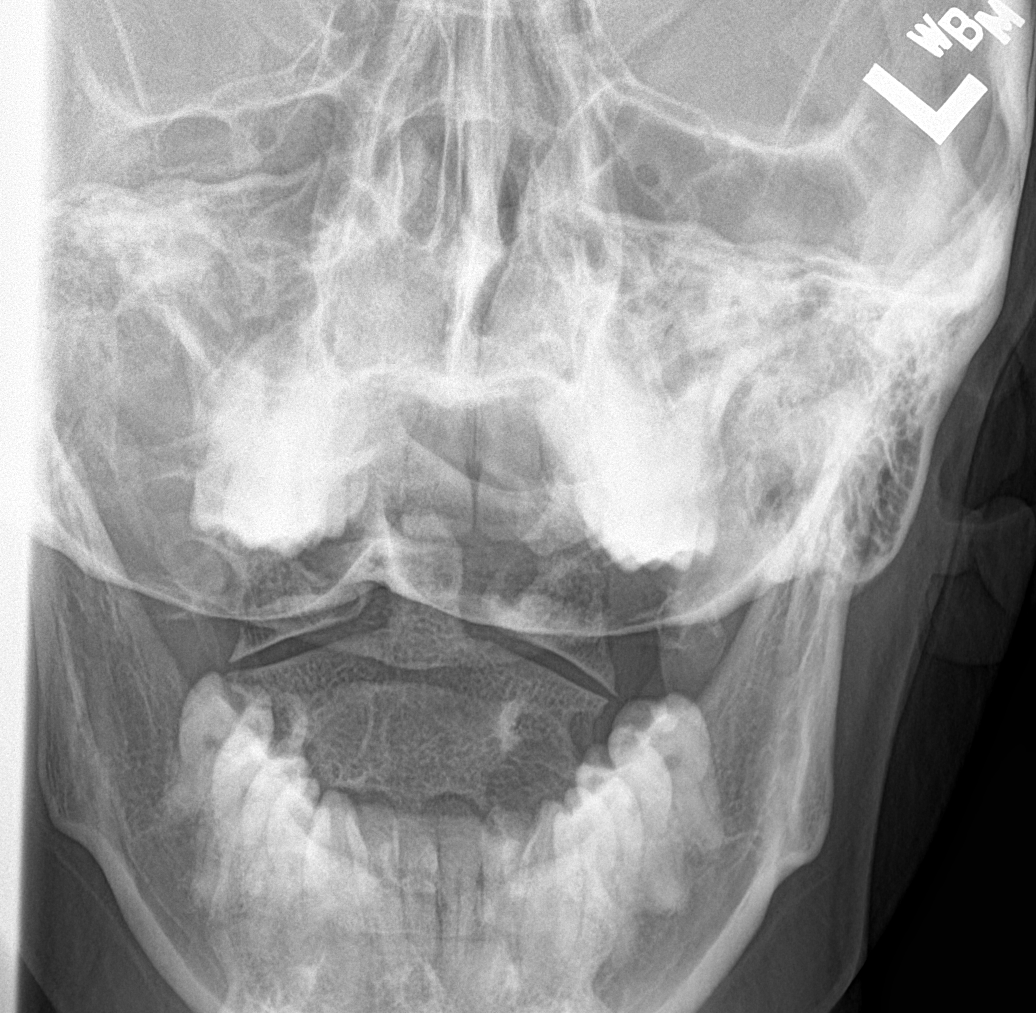
[im 6/6]
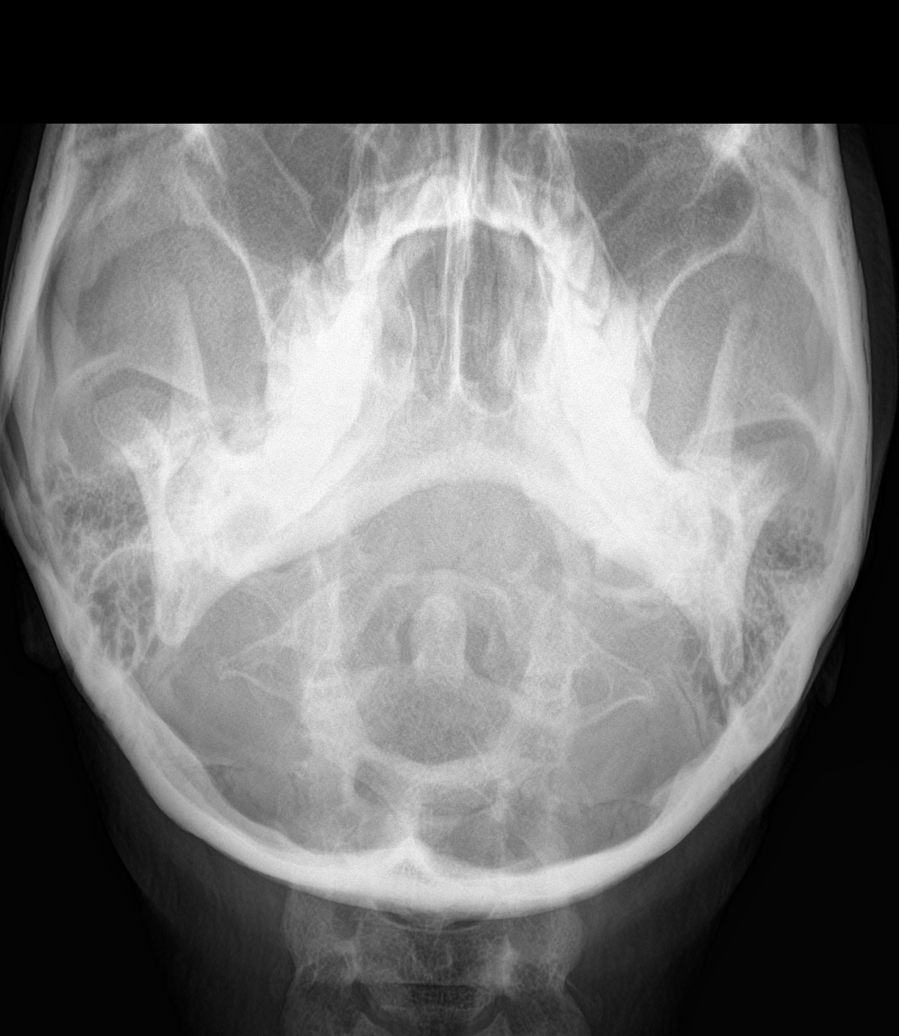

[6 of 6 positions shown; findings below may reference images not displayed]

FINDINGS: There is no evidence of cervical spine fracture or prevertebral soft
tissue swelling. Alignment is normal. No other significant bone
abnormalities are identified.
IMPRESSION: Negative cervical spine radiographs.

## 2020-03-17 ENCOUNTER — Other Ambulatory Visit: Payer: Self-pay

## 2020-03-17 ENCOUNTER — Ambulatory Visit (INDEPENDENT_AMBULATORY_CARE_PROVIDER_SITE_OTHER): Payer: 59 | Admitting: Physician Assistant

## 2020-03-17 ENCOUNTER — Encounter: Payer: Self-pay | Admitting: Physician Assistant

## 2020-03-17 VITALS — BP 118/68 | HR 102 | Temp 97.1°F | Ht 64.0 in | Wt 154.4 lb

## 2020-03-17 DIAGNOSIS — Z0000 Encounter for general adult medical examination without abnormal findings: Secondary | ICD-10-CM
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
## 2020-03-17 NOTE — Patient Instructions (Signed)
Preventive Care 21-27 Years Old, Female Preventive care refers to visits with your health care provider and lifestyle choices that can promote health and wellness. This includes:  A yearly physical exam. This may also be called an annual well check.  Regular dental visits and eye exams.  Immunizations.  Screening for certain conditions.  Healthy lifestyle choices, such as eating a healthy diet, getting regular exercise, not using drugs or products that contain nicotine and tobacco, and limiting alcohol use. What can I expect for my preventive care visit? Physical exam Your health care provider will check your:  Height and weight. This may be used to calculate body mass index (BMI), which tells if you are at a healthy weight.  Heart rate and blood pressure.  Skin for abnormal spots. Counseling Your health care provider may ask you questions about your:  Alcohol, tobacco, and drug use.  Emotional well-being.  Home and relationship well-being.  Sexual activity.  Eating habits.  Work and work environment.  Method of birth control.  Menstrual cycle.  Pregnancy history. What immunizations do I need?  Influenza (flu) vaccine  This is recommended every year. Tetanus, diphtheria, and pertussis (Tdap) vaccine  You may need a Td booster every 10 years. Varicella (chickenpox) vaccine  You may need this if you have not been vaccinated. Human papillomavirus (HPV) vaccine  If recommended by your health care provider, you may need three doses over 6 months. Measles, mumps, and rubella (MMR) vaccine  You may need at least one dose of MMR. You may also need a second dose. Meningococcal conjugate (MenACWY) vaccine  One dose is recommended if you are age 19-21 years and a first-year college student living in a residence hall, or if you have one of several medical conditions. You may also need additional booster doses. Pneumococcal conjugate (PCV13) vaccine  You may need  this if you have certain conditions and were not previously vaccinated. Pneumococcal polysaccharide (PPSV23) vaccine  You may need one or two doses if you smoke cigarettes or if you have certain conditions. Hepatitis A vaccine  You may need this if you have certain conditions or if you travel or work in places where you may be exposed to hepatitis A. Hepatitis B vaccine  You may need this if you have certain conditions or if you travel or work in places where you may be exposed to hepatitis B. Haemophilus influenzae type b (Hib) vaccine  You may need this if you have certain conditions. You may receive vaccines as individual doses or as more than one vaccine together in one shot (combination vaccines). Talk with your health care provider about the risks and benefits of combination vaccines. What tests do I need?  Blood tests  Lipid and cholesterol levels. These may be checked every 5 years starting at age 20.  Hepatitis C test.  Hepatitis B test. Screening  Diabetes screening. This is done by checking your blood sugar (glucose) after you have not eaten for a while (fasting).  Sexually transmitted disease (STD) testing.  BRCA-related cancer screening. This may be done if you have a family history of breast, ovarian, tubal, or peritoneal cancers.  Pelvic exam and Pap test. This may be done every 3 years starting at age 21. Starting at age 30, this may be done every 5 years if you have a Pap test in combination with an HPV test. Talk with your health care provider about your test results, treatment options, and if necessary, the need for more tests.   Follow these instructions at home: Eating and drinking   Eat a diet that includes fresh fruits and vegetables, whole grains, lean protein, and low-fat dairy.  Take vitamin and mineral supplements as recommended by your health care provider.  Do not drink alcohol if: ? Your health care provider tells you not to drink. ? You are  pregnant, may be pregnant, or are planning to become pregnant.  If you drink alcohol: ? Limit how much you have to 0-1 drink a day. ? Be aware of how much alcohol is in your drink. In the [place] one drink equals one 12 oz bottle of beer (355 mL), one 5 oz glass of wine (148 mL), or one 1 oz glass of hard liquor (44 mL). Lifestyle  Take daily care of your teeth and gums.  Stay active. Exercise for at least 30 minutes on 5 or more days each week.  Do not use any products that contain nicotine or tobacco, such as cigarettes, e-cigarettes, and chewing tobacco. If you need help quitting, ask your health care provider.  If you are sexually active, practice safe sex. Use a condom or other form of birth control (contraception) in order to prevent pregnancy and STIs (sexually transmitted infections). If you plan to become pregnant, see your health care provider for a preconception visit. What's next?  Visit your health care provider once a year for a well check visit.  Ask your health care provider how often you should have your eyes and teeth checked.  Stay up to date on all vaccines. This information is not intended to replace advice given to you by your health care provider. Make sure you discuss any questions you have with your health care provider. Document Revised: 04/30/2018 Document Reviewed: 04/30/2018 Elsevier Patient Education  2020 [employer].

## 2020-03-24 ENCOUNTER — Encounter: Payer: 59 | Admitting: Family Medicine

## 2020-04-30 ENCOUNTER — Encounter: Payer: Self-pay | Admitting: Emergency Medicine

## 2020-04-30 ENCOUNTER — Ambulatory Visit
Admission: EM | Admit: 2020-04-30 | Discharge: 2020-04-30 | Disposition: A | Discharge status: Home / Self Care | Payer: 59 | Attending: Family Medicine | Admitting: Family Medicine

## 2020-04-30 ENCOUNTER — Other Ambulatory Visit: Payer: Self-pay

## 2020-04-30 DIAGNOSIS — R1013 Epigastric pain: Secondary | ICD-10-CM | POA: Diagnosis not present

## 2020-04-30 MED ORDER — PANTOPRAZOLE SODIUM 40 MG PO TBEC: 40.0000 mg | DELAYED_RELEASE_TABLET | Freq: Two times a day (BID) | ORAL | 1 refills | Status: DC
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
## 2020-04-30 NOTE — ED Triage Notes (Signed)
Patient c/o stomach pain, gas, and nausea that started on Tuesday.  Patient denies fevers. Patient denies any urinary symptoms. Patient states last BM was yesterday and has slightly hard stools.

## 2020-04-30 NOTE — Discharge Instructions (Signed)
Medication as prescribed.  If persists, recommend GI referral for endoscopy.  Take care  Dr. Adriana Simas

## 2020-07-01 IMAGING — CR DG HIP (WITH OR WITHOUT PELVIS) 2-3V RIGHT
2 series · 2 of 2 positions shown · non-contrast
Comparison: Pelvis x-ray earlier tonight.

CLINICAL DATA: Post fall with right hip pain.

EXAM:
DG HIP (WITH OR WITHOUT PELVIS) 2-3V RIGHT

[hip ap]
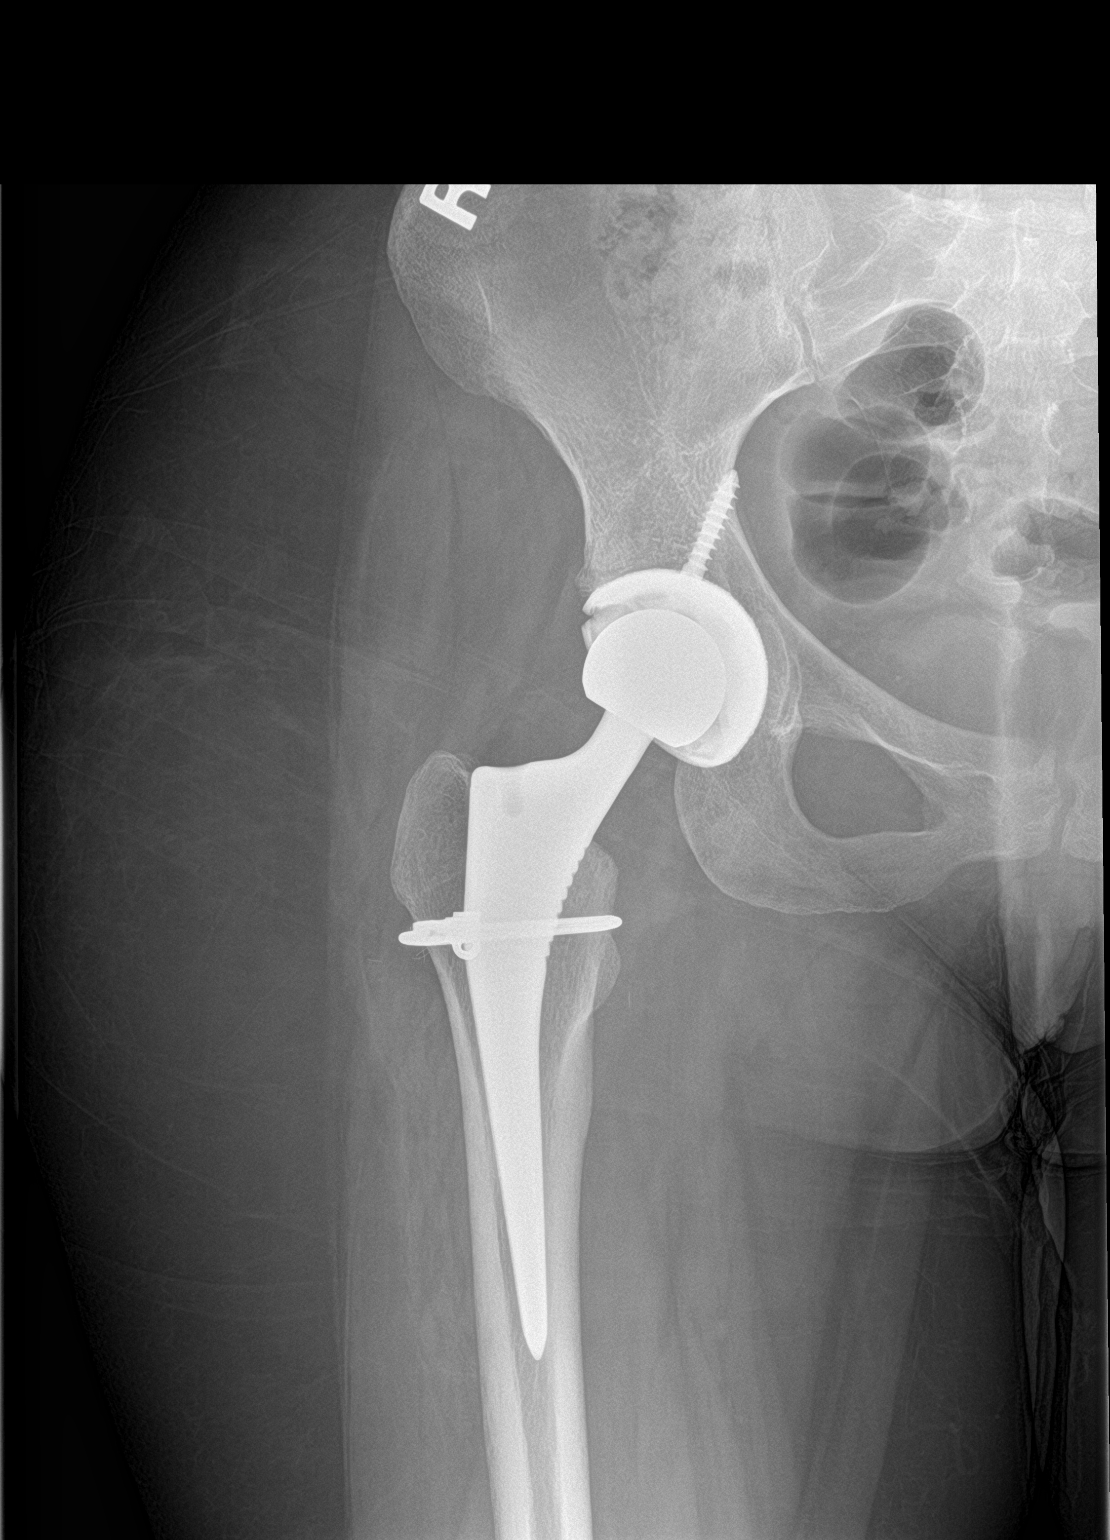

[hip lat]
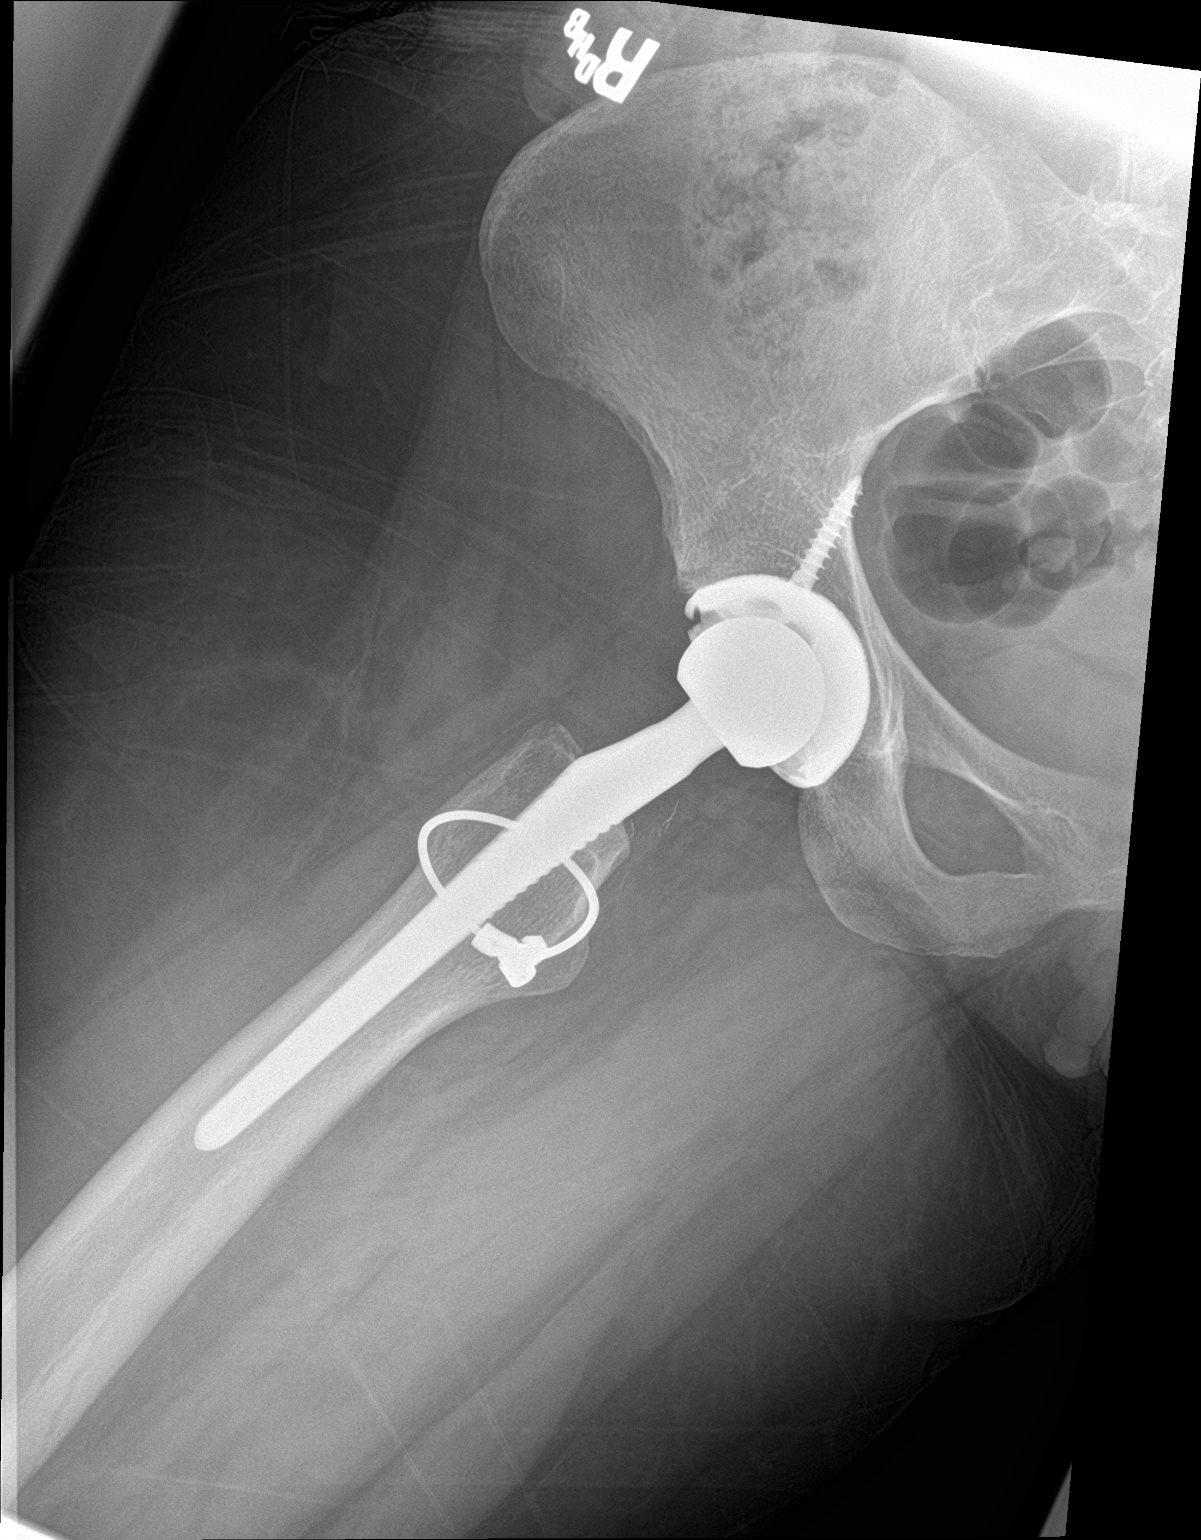

[2 of 2 positions shown; findings below may reference images not displayed]

FINDINGS: Right hip arthroplasty in expected alignment. No periprosthetic
lucency or fracture. Pubic rami are intact without acute fracture.
Included sacral ala are maintained. No focal soft tissue
abnormality.
IMPRESSION: Right hip arthroplasty without complication or acute fracture.

## 2020-07-01 IMAGING — CR PELVIS - 1-2 VIEW
1 series · 1 of 1 positions shown · non-contrast
Comparison: None.

CLINICAL DATA: Right hip pain after fall on hardwood floor.

EXAM:
PELVIS - 1-2 VIEW

[pelvis ap]
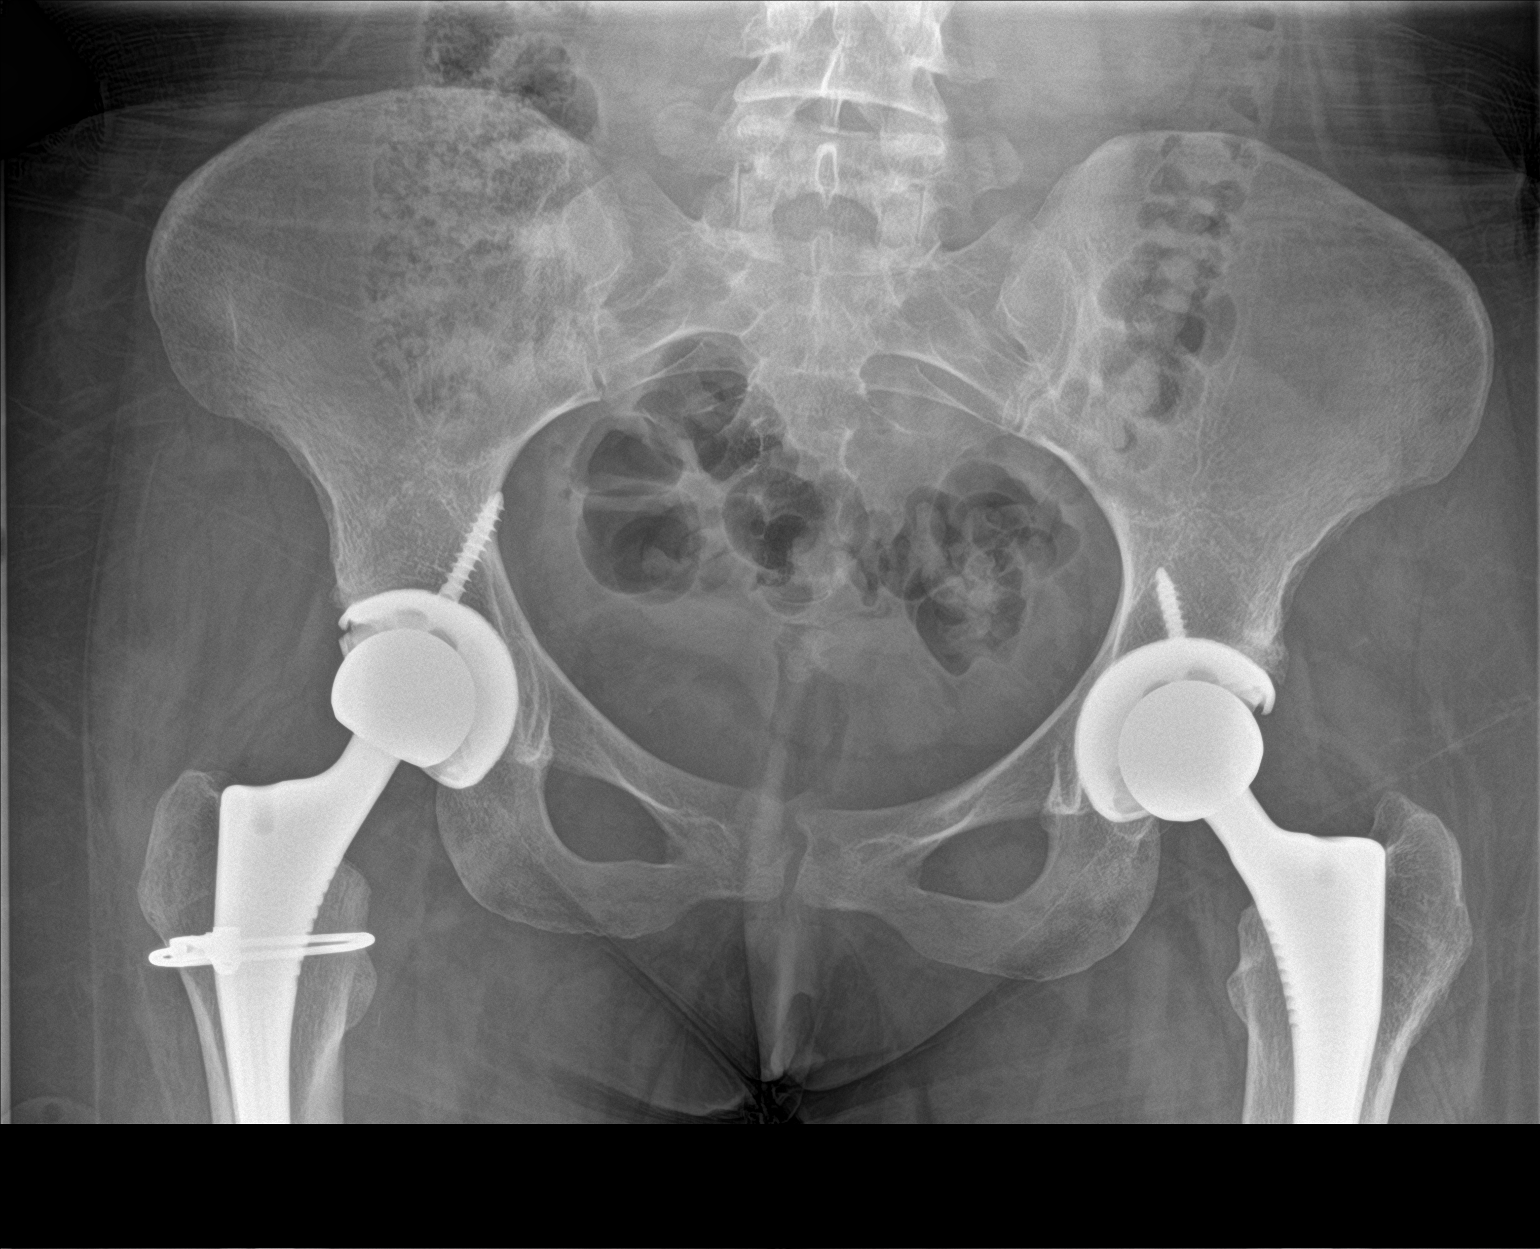

[1 of 1 positions shown; findings below may reference images not displayed]

FINDINGS: Bilateral total hip arthroplasties. Distal femoral stems are not
included in the field of view. No periprosthetic fracture where
visualized. Pubic rami are intact. Pubic symphysis and sacroiliac
joints are congruent.
IMPRESSION: Bilateral total hip arthroplasties. No acute or periprosthetic
fracture. Distal aspect of the femoral stems are not included in the
field of view.

## 2020-07-05 ENCOUNTER — Encounter: Payer: Self-pay | Admitting: Physician Assistant

## 2020-07-05 ENCOUNTER — Other Ambulatory Visit: Payer: Self-pay

## 2020-07-05 ENCOUNTER — Ambulatory Visit: Payer: 59 | Admitting: Physician Assistant

## 2020-07-05 VITALS — BP 106/66 | HR 72 | Temp 98.3°F | Wt 154.2 lb

## 2020-07-05 DIAGNOSIS — R1084 Generalized abdominal pain: Secondary | ICD-10-CM

## 2020-07-05 DIAGNOSIS — R197 Diarrhea, unspecified: Secondary | ICD-10-CM

## 2020-07-05 NOTE — Patient Instructions (Addendum)
pepcid 20 mg

## 2020-07-05 NOTE — Progress Notes (Signed)
Established patient visit   Patient: Lauren Weber   DOB: [birth year]   29 y.o. Female  MRN: [id number] Visit Date: 07/05/2020  Today's healthcare provider: Trey Sailors, PA-C   Chief Complaint  Patient presents with  . Abdominal Pain  I,Porsha C McClurkin,acting as a scribe for Trey Sailors, PA-C.,have documented all relevant documentation on the behalf of Trey Sailors, PA-C,as directed by  Trey Sailors, PA-C while in the presence of Trey Sailors, PA-C.  Subjective    HPI  Abdominal Pain  She reports recurrent abdominal pain. The most recent episode started a few months ago and is worsening. The abdominal pain is located in the right side, right upper quadrant, right lower quadrant, left side, left upper quadrant, left lower quadrant, epigastrium, periumbilical region, suprapubic area, upper abdomen and lower abdomen and does not radiate. It is described as burning, is 7/10 in intensity, occurring constantly. It is aggravated by eating food and is relieved by having a bowel movement. She has tried pantoprazole with no relief. She works taking Pepcid as well. She has tried reducing her dairy intake.   Associated symptoms: No anorexia  Yes belching  No bloody stool No blood in urine   Yes constipation No diarrhea  No dysuria No fever  No flatus No headaches  No headaches No joint pains  No myalgias Yes nausea  No vomiting No weight loss     Recent GI studies:None   Previous labs Lab Results  Component Value Date   WBC 7.1 04/13/2019   HGB 12.9 04/13/2019   HCT 38.3 04/13/2019   MCV 89 04/13/2019   MCH 29.9 04/13/2019   RDW 11.9 04/13/2019   PLT 384 04/13/2019   Lab Results  Component Value Date   GLUCOSE 90 04/13/2019   NA 141 04/13/2019   K 4.8 04/13/2019   CL 104 04/13/2019   CO2 23 04/13/2019   BUN 11 04/13/2019   CREATININE 0.65 04/13/2019   GFRNONAA 123 04/13/2019   GFRAA 142 04/13/2019   CALCIUM 10.2 04/13/2019   PROT 6.6  04/13/2019   ALBUMIN 4.6 04/13/2019   LABGLOB 2.0 04/13/2019   AGRATIO 2.3 (H) 04/13/2019   BILITOT 0.3 04/13/2019   ALKPHOS 60 04/13/2019   AST 15 04/13/2019   ALT 13 04/13/2019   No results found for: AMYLASE -----------------------------------------------------------------------------------------      Medications: Outpatient Medications Prior to Visit  Medication Sig  . acetaminophen (TYLENOL) 650 MG CR tablet Take 650 mg by mouth every 8 (eight) hours as needed for pain.  Marland Kitchen albuterol (PROAIR HFA) 108 (90 Base) MCG/ACT inhaler Inhale 2 puffs into the lungs as needed.  . pantoprazole (PROTONIX) 40 MG tablet Take 1 tablet (40 mg total) by mouth 2 (two) times daily before a meal.  . buPROPion (WELLBUTRIN) 75 MG tablet Take 1 tablet (75 mg total) by mouth 2 (two) times daily. (Patient not taking: Reported on 07/05/2020)  . Norethindrone Acetate-Ethinyl Estradiol (JUNEL 1.5/30) 1.5-30 MG-MCG tablet Take 1 tablet by mouth daily. (Patient not taking: Reported on 07/05/2020)   No facility-administered medications prior to visit.    Review of Systems    Objective    BP 106/66 (BP Location: Left Arm, Patient Position: Sitting, Cuff Size: Large)   Pulse 72   Temp 98.3 F (36.8 C) (Oral)   Wt 154 lb 3.2 oz (69.9 kg)   SpO2 100%   BMI 26.47 kg/m    Physical Exam Constitutional:  Appearance: Normal appearance. She is well-developed.  Cardiovascular:     Rate and Rhythm: Normal rate and regular rhythm.     Heart sounds: Normal heart sounds.  Pulmonary:     Effort: Pulmonary effort is normal.     Breath sounds: Normal breath sounds.  Abdominal:     General: Abdomen is flat. Bowel sounds are normal.     Palpations: Abdomen is soft.     Tenderness: There is no abdominal tenderness.  Skin:    General: Skin is warm and dry.  Neurological:     General: No focal deficit present.     Mental Status: She is alert and oriented to person, place, and time. Mental status is at  baseline.  Psychiatric:        Mood and Affect: Mood normal.        Behavior: Behavior normal.       No results found for any visits on 07/05/20.  Assessment & Plan    1. Generalized abdominal pain  Recommend eliminating dairy. Will test as below. May have component of IBS but cannot rule out cholelithiasis or IBD. If symptoms persist, would recommend further evaluation by GI.   - CBC with Differential - Comprehensive Metabolic Panel (CMET) - Tissue transglutaminase, IgA - IgA  2. Diarrhea, unspecified type  - CBC with Differential - Comprehensive Metabolic Panel (CMET) - Tissue transglutaminase, IgA - IgA   No follow-ups on file.      ITrey Sailors, PA-C, have reviewed all documentation for this visit. The documentation on 07/12/20 for the exam, diagnosis, procedures, and orders are all accurate and complete.  The entirety of the information documented in the History of Present Illness, Review of Systems and Physical Exam were personally obtained by me. Portions of this information were initially documented by [facility] and reviewed by me for thoroughness and accuracy.   I spent 20 minutes dedicated to the care of this patient on the date of this encounter to include pre-visit review of records, face-to-face time with the patient discussing abdominal pain, and post visit ordering of testing.   Maryella Shivers  [facility], [place] [phone] (phone) [phone] (fax)  [facility]

## 2020-09-06 ENCOUNTER — Other Ambulatory Visit: Payer: Self-pay | Admitting: Adult Reconstructive Orthopaedic Surgery

## 2020-11-20 ENCOUNTER — Other Ambulatory Visit: Payer: Self-pay | Admitting: Nurse Practitioner

## 2020-11-20 DIAGNOSIS — R0989 Other specified symptoms and signs involving the circulatory and respiratory systems: Secondary | ICD-10-CM | POA: Diagnosis not present

## 2020-11-20 DIAGNOSIS — R059 Cough, unspecified: Secondary | ICD-10-CM | POA: Diagnosis not present

## 2020-11-20 DIAGNOSIS — H9209 Otalgia, unspecified ear: Secondary | ICD-10-CM | POA: Diagnosis not present

## 2020-11-20 DIAGNOSIS — J029 Acute pharyngitis, unspecified: Secondary | ICD-10-CM | POA: Diagnosis not present

## 2021-01-18 DIAGNOSIS — Z96642 Presence of left artificial hip joint: Secondary | ICD-10-CM | POA: Diagnosis not present

## 2021-01-18 DIAGNOSIS — Z96641 Presence of right artificial hip joint: Secondary | ICD-10-CM | POA: Diagnosis not present

## 2021-01-18 DIAGNOSIS — Z471 Aftercare following joint replacement surgery: Secondary | ICD-10-CM | POA: Diagnosis not present

## 2021-01-18 DIAGNOSIS — Z96643 Presence of artificial hip joint, bilateral: Secondary | ICD-10-CM | POA: Diagnosis not present

## 2021-03-23 ENCOUNTER — Encounter: Payer: Self-pay | Admitting: Physician Assistant

## 2021-03-28 ENCOUNTER — Emergency Department: Payer: 59

## 2021-03-28 ENCOUNTER — Emergency Department
Admission: EM | Admit: 2021-03-28 | Discharge: 2021-03-28 | Disposition: A | Discharge status: Other Acute Inpatient Hospital | Payer: 59 | Attending: Emergency Medicine | Admitting: Emergency Medicine

## 2021-03-28 ENCOUNTER — Other Ambulatory Visit: Payer: Self-pay

## 2021-03-28 DIAGNOSIS — S72322A Displaced transverse fracture of shaft of left femur, initial encounter for closed fracture: Secondary | ICD-10-CM | POA: Diagnosis not present

## 2021-03-28 DIAGNOSIS — W108XXA Fall (on) (from) other stairs and steps, initial encounter: Secondary | ICD-10-CM | POA: Insufficient documentation

## 2021-03-28 DIAGNOSIS — S728X2A Other fracture of left femur, initial encounter for closed fracture: Secondary | ICD-10-CM | POA: Diagnosis not present

## 2021-03-28 DIAGNOSIS — Z9181 History of falling: Secondary | ICD-10-CM | POA: Diagnosis not present

## 2021-03-28 DIAGNOSIS — U071 COVID-19: Secondary | ICD-10-CM | POA: Insufficient documentation

## 2021-03-28 DIAGNOSIS — I959 Hypotension, unspecified: Secondary | ICD-10-CM | POA: Diagnosis not present

## 2021-03-28 DIAGNOSIS — Y93K1 Activity, walking an animal: Secondary | ICD-10-CM | POA: Insufficient documentation

## 2021-03-28 DIAGNOSIS — S72302A Unspecified fracture of shaft of left femur, initial encounter for closed fracture: Secondary | ICD-10-CM | POA: Diagnosis not present

## 2021-03-28 DIAGNOSIS — S7292XA Unspecified fracture of left femur, initial encounter for closed fracture: Secondary | ICD-10-CM

## 2021-03-28 DIAGNOSIS — R609 Edema, unspecified: Secondary | ICD-10-CM | POA: Diagnosis not present

## 2021-03-28 DIAGNOSIS — M62838 Other muscle spasm: Secondary | ICD-10-CM | POA: Diagnosis not present

## 2021-03-28 DIAGNOSIS — R52 Pain, unspecified: Secondary | ICD-10-CM | POA: Diagnosis not present

## 2021-03-28 DIAGNOSIS — Z96643 Presence of artificial hip joint, bilateral: Secondary | ICD-10-CM | POA: Diagnosis not present

## 2021-03-28 DIAGNOSIS — M9702XA Periprosthetic fracture around internal prosthetic left hip joint, initial encounter: Secondary | ICD-10-CM | POA: Diagnosis not present

## 2021-03-28 DIAGNOSIS — F419 Anxiety disorder, unspecified: Secondary | ICD-10-CM | POA: Diagnosis not present

## 2021-03-28 DIAGNOSIS — J45909 Unspecified asthma, uncomplicated: Secondary | ICD-10-CM | POA: Insufficient documentation

## 2021-03-28 DIAGNOSIS — F988 Other specified behavioral and emotional disorders with onset usually occurring in childhood and adolescence: Secondary | ICD-10-CM | POA: Diagnosis not present

## 2021-03-28 DIAGNOSIS — G8929 Other chronic pain: Secondary | ICD-10-CM | POA: Diagnosis not present

## 2021-03-28 DIAGNOSIS — M1612 Unilateral primary osteoarthritis, left hip: Secondary | ICD-10-CM | POA: Diagnosis not present

## 2021-03-28 DIAGNOSIS — R001 Bradycardia, unspecified: Secondary | ICD-10-CM | POA: Diagnosis not present

## 2021-03-28 DIAGNOSIS — S8992XA Unspecified injury of left lower leg, initial encounter: Secondary | ICD-10-CM | POA: Diagnosis present

## 2021-03-28 DIAGNOSIS — J302 Other seasonal allergic rhinitis: Secondary | ICD-10-CM | POA: Diagnosis not present

## 2021-03-28 DIAGNOSIS — R0902 Hypoxemia: Secondary | ICD-10-CM | POA: Diagnosis not present

## 2021-03-28 LAB — CBC WITH DIFFERENTIAL/PLATELET
Abs Immature Granulocytes: 0.06 10*3/uL (ref 0.00–0.07)
Basophils Absolute: 0.1 10*3/uL (ref 0.0–0.1)
Basophils Relative: 1 %
Eosinophils Absolute: 0.1 10*3/uL (ref 0.0–0.5)
Eosinophils Relative: 1 %
HCT: 34.9 % — ABNORMAL LOW (ref 36.0–46.0)
Hemoglobin: 12 g/dL (ref 12.0–15.0)
Immature Granulocytes: 0 %
Lymphocytes Relative: 14 %
Lymphs Abs: 1.8 10*3/uL (ref 0.7–4.0)
MCH: 30.8 pg (ref 26.0–34.0)
MCHC: 34.4 g/dL (ref 30.0–36.0)
MCV: 89.7 fL (ref 80.0–100.0)
Monocytes Absolute: 0.7 10*3/uL (ref 0.1–1.0)
Monocytes Relative: 5 %
Neutro Abs: 10.6 10*3/uL — ABNORMAL HIGH (ref 1.7–7.7)
Neutrophils Relative %: 79 %
Platelets: 347 10*3/uL (ref 150–400)
RBC: 3.89 MIL/uL (ref 3.87–5.11)
RDW: 12.6 % (ref 11.5–15.5)
WBC: 13.4 10*3/uL — ABNORMAL HIGH (ref 4.0–10.5)
nRBC: 0 % (ref 0.0–0.2)

## 2021-03-28 LAB — RESP PANEL BY RT-PCR (FLU A&B, COVID) ARPGX2
Influenza A by PCR: NEGATIVE
Influenza B by PCR: NEGATIVE
SARS Coronavirus 2 by RT PCR: POSITIVE — AB

## 2021-03-28 LAB — BASIC METABOLIC PANEL
Anion gap: 5 (ref 5–15)
BUN: 14 mg/dL (ref 6–20)
CO2: 24 mmol/L (ref 22–32)
Calcium: 8.3 mg/dL — ABNORMAL LOW (ref 8.9–10.3)
Chloride: 109 mmol/L (ref 98–111)
Creatinine, Ser: 0.78 mg/dL (ref 0.44–1.00)
GFR, Estimated: >60 mL/min (ref >60)
Glucose, Bld: 101 mg/dL — ABNORMAL HIGH (ref 70–99)
Potassium: 3.7 mmol/L (ref 3.5–5.1)
Sodium: 138 mmol/L (ref 135–145)

## 2021-03-28 MED ORDER — HYDROMORPHONE HCL 1 MG/ML IJ SOLN
1.0000 mg | Freq: Once | INTRAMUSCULAR | Status: AC
  Administered 2021-03-28: 1 mg via INTRAVENOUS
  Filled 2021-03-28: qty 1

## 2021-03-28 MED ORDER — ONDANSETRON HCL 4 MG/2ML IJ SOLN
4.0000 mg | Freq: Once | INTRAMUSCULAR | Status: AC
  Administered 2021-03-28: 4 mg via INTRAVENOUS
  Filled 2021-03-28: qty 2
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
## 2021-03-28 NOTE — ED Triage Notes (Signed)
Patient brought in via ems from home. Patient was walking dog when she fell down steps injuring left femur. Patient has hx of bilat hip replacements

## 2021-04-03 ENCOUNTER — Other Ambulatory Visit: Payer: Self-pay | Admitting: *Deleted

## 2021-04-03 ENCOUNTER — Encounter: Payer: Self-pay | Admitting: *Deleted

## 2021-04-03 DIAGNOSIS — S7292XA Unspecified fracture of left femur, initial encounter for closed fracture: Secondary | ICD-10-CM | POA: Insufficient documentation

## 2021-04-03 NOTE — Patient Outreach (Signed)
[facility]) Care Management  04/03/2021  Lauren Weber 19-Mar-1991 [id number]   Transition of care call/case closure   Referral received7/29/22 Initial outreach:04/03/21 Insurance: [employer]    Subjective: Initial successful telephone call to patient's preferred number in order to complete transition of care assessment; 2 HIPAA identifiers verified. Explained purpose of call and completed transition of care assessment.  Lauren Weber states she is doing okay, but tired today. She  denies post-operative problems, says surgical incisions are unremarkable with dry dressing placed after her shower today. She states surgical pain well managed with prescribed medications, tolerating diet, denies bowel or bladder problems.  She states that her grandmother is assisting with her  recovery. She discussed self administering Lovenox , reports being educated prior to discharge. She discussed tolerating mobility in home using rolling walker .   Reviewed accessing the following Lauren Weber Benefits :  She does not have the hospital indemnity She uses a [facility] outpatient pharmacy at [facility] employee pharmacy.     Objective:  Lauren Weber  was hospitalized at [facility] 7/27-04/02/21 ORIF of left femur fracture after fall down stairs at home Comorbidities include: Bilateral Hip replacements,  She was discharged to home on 04/02/21 without the need for home health services or DME.   Assessment:  Patient voices good understanding of all discharge instructions.  See transition of care flowsheet for assessment details.   Plan:  Reviewed hospital discharge diagnosis of ORIF left femur   and discharge treatment plan using hospital discharge instructions, assessing medication adherence, reviewing problems requiring provider notification, and discussing the importance of follow up with surgeon, primary care provider and/or specialists as directed. Patient discussed plan discussion with  orthopedist surgeon at visit regarding outpatient physical therapy at [facility] that she has attended after prior surgery.   Reviewed [employer] healthy lifestyle program information to receive discounted premium for  2023   Step 1: Get  your annual physical  Step 2: Complete your health assessment  Step 3:Identify your current health status and complete the corresponding action step between September 02, 2020 and May 03, 2021.     No ongoing care management needs identified so will close case to [facility] Care Management services and route successful outreach letter with [facility] Care Management pamphlet and 24 Hour Nurse Line Magnet to [employer] Care Management clinical pool to be mailed to patient's home address.  Thanked patient for their services to [facility].  Egbert Garibaldi, RN, BSN  [facility] Care Management,Care Management Coordinator  [phone]- Mobile [phone]- Toll Free Main Office

## 2021-04-09 ENCOUNTER — Other Ambulatory Visit: Payer: Self-pay

## 2021-04-09 MED ORDER — OXYCODONE-ACETAMINOPHEN 5-325 MG PO TABS
ORAL_TABLET | ORAL | 0 refills | Status: DC
  Filled 2021-04-09: qty 28, 7d supply, fill #0

## 2021-04-10 ENCOUNTER — Other Ambulatory Visit: Payer: Self-pay

## 2021-04-18 ENCOUNTER — Other Ambulatory Visit: Payer: Self-pay

## 2021-04-18 MED ORDER — OXYCODONE HCL 5 MG PO TABS
ORAL_TABLET | ORAL | 0 refills | Status: DC
  Filled 2021-04-18: qty 14, 7d supply, fill #0

## 2021-04-19 ENCOUNTER — Other Ambulatory Visit: Payer: Self-pay

## 2021-04-26 ENCOUNTER — Telehealth: Payer: Self-pay | Admitting: Family Medicine

## 2021-04-26 ENCOUNTER — Other Ambulatory Visit: Payer: Self-pay | Admitting: Family Medicine

## 2021-04-26 NOTE — Telephone Encounter (Signed)
Not prescribed in over a year. No refill until follow up appointment to assess need.

## 2021-04-26 NOTE — Progress Notes (Unsigned)
adder

## 2021-04-26 NOTE — Telephone Encounter (Signed)
Medication Refill - Medication:  amphetamine-dextroamphetamine (ADDERALL) 5 MG tablet .  Has the patient contacted their pharmacy? No.  Preferred Pharmacy (with phone number or street name):  [facility]  Phone:  [phone] Fax:  [phone]  Agent: Please be advised that RX refills may take up to 3 business days. We ask that you follow-up with your pharmacy.

## 2021-04-26 NOTE — Telephone Encounter (Signed)
Requested medication not on current list  

## 2021-04-27 ENCOUNTER — Other Ambulatory Visit: Payer: Self-pay

## 2021-04-27 MED ORDER — ENOXAPARIN SODIUM 30 MG/0.3ML IJ SOSY
PREFILLED_SYRINGE | INTRAMUSCULAR | 0 refills | Status: DC
  Filled 2021-04-27: qty 8.4, 14d supply, fill #0

## 2021-04-30 ENCOUNTER — Other Ambulatory Visit: Payer: Self-pay

## 2021-05-01 ENCOUNTER — Other Ambulatory Visit: Payer: Self-pay

## 2021-05-22 DIAGNOSIS — Z880 Allergy status to penicillin: Secondary | ICD-10-CM | POA: Diagnosis not present

## 2021-05-22 DIAGNOSIS — S7292XS Unspecified fracture of left femur, sequela: Secondary | ICD-10-CM | POA: Diagnosis not present

## 2021-05-22 DIAGNOSIS — R2689 Other abnormalities of gait and mobility: Secondary | ICD-10-CM | POA: Diagnosis not present

## 2021-05-22 DIAGNOSIS — Z888 Allergy status to other drugs, medicaments and biological substances status: Secondary | ICD-10-CM | POA: Diagnosis not present

## 2021-05-22 DIAGNOSIS — S72352D Displaced comminuted fracture of shaft of left femur, subsequent encounter for closed fracture with routine healing: Secondary | ICD-10-CM | POA: Diagnosis not present

## 2021-05-25 DIAGNOSIS — S7292XS Unspecified fracture of left femur, sequela: Secondary | ICD-10-CM | POA: Diagnosis not present

## 2021-05-25 DIAGNOSIS — R2689 Other abnormalities of gait and mobility: Secondary | ICD-10-CM | POA: Diagnosis not present

## 2021-05-29 DIAGNOSIS — S7292XS Unspecified fracture of left femur, sequela: Secondary | ICD-10-CM | POA: Diagnosis not present

## 2021-05-29 DIAGNOSIS — R2689 Other abnormalities of gait and mobility: Secondary | ICD-10-CM | POA: Diagnosis not present

## 2021-06-01 DIAGNOSIS — R2689 Other abnormalities of gait and mobility: Secondary | ICD-10-CM | POA: Diagnosis not present

## 2021-06-01 DIAGNOSIS — S7292XS Unspecified fracture of left femur, sequela: Secondary | ICD-10-CM | POA: Diagnosis not present

## 2021-06-05 DIAGNOSIS — S7292XS Unspecified fracture of left femur, sequela: Secondary | ICD-10-CM | POA: Diagnosis not present

## 2021-06-05 DIAGNOSIS — R2689 Other abnormalities of gait and mobility: Secondary | ICD-10-CM | POA: Diagnosis not present

## 2021-06-08 DIAGNOSIS — R2689 Other abnormalities of gait and mobility: Secondary | ICD-10-CM | POA: Diagnosis not present

## 2021-06-08 DIAGNOSIS — S7292XS Unspecified fracture of left femur, sequela: Secondary | ICD-10-CM | POA: Diagnosis not present

## 2021-06-12 DIAGNOSIS — R2689 Other abnormalities of gait and mobility: Secondary | ICD-10-CM | POA: Diagnosis not present

## 2021-06-12 DIAGNOSIS — S7292XS Unspecified fracture of left femur, sequela: Secondary | ICD-10-CM | POA: Diagnosis not present

## 2021-06-15 DIAGNOSIS — S7292XS Unspecified fracture of left femur, sequela: Secondary | ICD-10-CM | POA: Diagnosis not present

## 2021-06-15 DIAGNOSIS — R2689 Other abnormalities of gait and mobility: Secondary | ICD-10-CM | POA: Diagnosis not present

## 2021-06-19 DIAGNOSIS — S7292XS Unspecified fracture of left femur, sequela: Secondary | ICD-10-CM | POA: Diagnosis not present

## 2021-06-19 DIAGNOSIS — R2689 Other abnormalities of gait and mobility: Secondary | ICD-10-CM | POA: Diagnosis not present

## 2021-06-25 DIAGNOSIS — S7292XS Unspecified fracture of left femur, sequela: Secondary | ICD-10-CM | POA: Diagnosis not present

## 2021-06-25 DIAGNOSIS — R2689 Other abnormalities of gait and mobility: Secondary | ICD-10-CM | POA: Diagnosis not present

## 2021-06-28 DIAGNOSIS — S7292XS Unspecified fracture of left femur, sequela: Secondary | ICD-10-CM | POA: Diagnosis not present

## 2021-06-28 DIAGNOSIS — R2689 Other abnormalities of gait and mobility: Secondary | ICD-10-CM | POA: Diagnosis not present

## 2021-07-02 DIAGNOSIS — R2689 Other abnormalities of gait and mobility: Secondary | ICD-10-CM | POA: Diagnosis not present

## 2021-07-02 DIAGNOSIS — S7292XS Unspecified fracture of left femur, sequela: Secondary | ICD-10-CM | POA: Diagnosis not present

## 2021-07-03 DIAGNOSIS — S72352D Displaced comminuted fracture of shaft of left femur, subsequent encounter for closed fracture with routine healing: Secondary | ICD-10-CM | POA: Diagnosis not present

## 2021-07-03 DIAGNOSIS — M9702XA Periprosthetic fracture around internal prosthetic left hip joint, initial encounter: Secondary | ICD-10-CM | POA: Diagnosis not present

## 2021-07-03 DIAGNOSIS — Z967 Presence of other bone and tendon implants: Secondary | ICD-10-CM | POA: Diagnosis not present

## 2021-07-03 DIAGNOSIS — Z96642 Presence of left artificial hip joint: Secondary | ICD-10-CM | POA: Diagnosis not present

## 2021-07-03 DIAGNOSIS — M9702XD Periprosthetic fracture around internal prosthetic left hip joint, subsequent encounter: Secondary | ICD-10-CM | POA: Diagnosis not present

## 2021-07-05 DIAGNOSIS — S7292XS Unspecified fracture of left femur, sequela: Secondary | ICD-10-CM | POA: Diagnosis not present

## 2021-07-05 DIAGNOSIS — R2689 Other abnormalities of gait and mobility: Secondary | ICD-10-CM | POA: Diagnosis not present

## 2021-07-09 DIAGNOSIS — R2689 Other abnormalities of gait and mobility: Secondary | ICD-10-CM | POA: Diagnosis not present

## 2021-07-09 DIAGNOSIS — S7292XS Unspecified fracture of left femur, sequela: Secondary | ICD-10-CM | POA: Diagnosis not present

## 2021-07-12 DIAGNOSIS — R2689 Other abnormalities of gait and mobility: Secondary | ICD-10-CM | POA: Diagnosis not present

## 2021-07-12 DIAGNOSIS — S7292XS Unspecified fracture of left femur, sequela: Secondary | ICD-10-CM | POA: Diagnosis not present

## 2021-07-17 DIAGNOSIS — R2689 Other abnormalities of gait and mobility: Secondary | ICD-10-CM | POA: Diagnosis not present

## 2021-07-17 DIAGNOSIS — S7292XS Unspecified fracture of left femur, sequela: Secondary | ICD-10-CM | POA: Diagnosis not present

## 2021-07-23 DIAGNOSIS — R2689 Other abnormalities of gait and mobility: Secondary | ICD-10-CM | POA: Diagnosis not present

## 2021-07-23 DIAGNOSIS — S7292XS Unspecified fracture of left femur, sequela: Secondary | ICD-10-CM | POA: Diagnosis not present

## 2021-07-25 DIAGNOSIS — R2689 Other abnormalities of gait and mobility: Secondary | ICD-10-CM | POA: Diagnosis not present

## 2021-07-25 DIAGNOSIS — S7292XS Unspecified fracture of left femur, sequela: Secondary | ICD-10-CM | POA: Diagnosis not present

## 2021-07-31 DIAGNOSIS — S7292XS Unspecified fracture of left femur, sequela: Secondary | ICD-10-CM | POA: Diagnosis not present

## 2021-07-31 DIAGNOSIS — R2689 Other abnormalities of gait and mobility: Secondary | ICD-10-CM | POA: Diagnosis not present

## 2021-08-02 DIAGNOSIS — R2689 Other abnormalities of gait and mobility: Secondary | ICD-10-CM | POA: Diagnosis not present

## 2021-08-02 DIAGNOSIS — S7292XS Unspecified fracture of left femur, sequela: Secondary | ICD-10-CM | POA: Diagnosis not present

## 2021-08-06 DIAGNOSIS — R2689 Other abnormalities of gait and mobility: Secondary | ICD-10-CM | POA: Diagnosis not present

## 2021-08-06 DIAGNOSIS — S7292XS Unspecified fracture of left femur, sequela: Secondary | ICD-10-CM | POA: Diagnosis not present

## 2021-08-09 DIAGNOSIS — S7292XS Unspecified fracture of left femur, sequela: Secondary | ICD-10-CM | POA: Diagnosis not present

## 2021-08-09 DIAGNOSIS — R2689 Other abnormalities of gait and mobility: Secondary | ICD-10-CM | POA: Diagnosis not present

## 2021-08-13 DIAGNOSIS — R2689 Other abnormalities of gait and mobility: Secondary | ICD-10-CM | POA: Diagnosis not present

## 2021-08-13 DIAGNOSIS — S7292XS Unspecified fracture of left femur, sequela: Secondary | ICD-10-CM | POA: Diagnosis not present

## 2021-08-15 DIAGNOSIS — R2689 Other abnormalities of gait and mobility: Secondary | ICD-10-CM | POA: Diagnosis not present

## 2021-08-15 DIAGNOSIS — S7292XS Unspecified fracture of left femur, sequela: Secondary | ICD-10-CM | POA: Diagnosis not present

## 2021-08-20 DIAGNOSIS — S7292XS Unspecified fracture of left femur, sequela: Secondary | ICD-10-CM | POA: Diagnosis not present

## 2021-08-20 DIAGNOSIS — R2689 Other abnormalities of gait and mobility: Secondary | ICD-10-CM | POA: Diagnosis not present

## 2021-08-24 DIAGNOSIS — S7292XS Unspecified fracture of left femur, sequela: Secondary | ICD-10-CM | POA: Diagnosis not present

## 2021-08-24 DIAGNOSIS — R2689 Other abnormalities of gait and mobility: Secondary | ICD-10-CM | POA: Diagnosis not present

## 2021-08-28 DIAGNOSIS — R2689 Other abnormalities of gait and mobility: Secondary | ICD-10-CM | POA: Diagnosis not present

## 2021-08-28 DIAGNOSIS — S7292XS Unspecified fracture of left femur, sequela: Secondary | ICD-10-CM | POA: Diagnosis not present

## 2021-08-30 DIAGNOSIS — R2689 Other abnormalities of gait and mobility: Secondary | ICD-10-CM | POA: Diagnosis not present

## 2021-08-30 DIAGNOSIS — S7292XS Unspecified fracture of left femur, sequela: Secondary | ICD-10-CM | POA: Diagnosis not present

## 2021-09-05 DIAGNOSIS — R2689 Other abnormalities of gait and mobility: Secondary | ICD-10-CM | POA: Diagnosis not present

## 2021-09-05 DIAGNOSIS — S7292XS Unspecified fracture of left femur, sequela: Secondary | ICD-10-CM | POA: Diagnosis not present

## 2021-09-12 DIAGNOSIS — R2689 Other abnormalities of gait and mobility: Secondary | ICD-10-CM | POA: Diagnosis not present

## 2021-09-12 DIAGNOSIS — S7292XS Unspecified fracture of left femur, sequela: Secondary | ICD-10-CM | POA: Diagnosis not present

## 2021-09-18 ENCOUNTER — Other Ambulatory Visit: Payer: Self-pay

## 2021-09-18 MED ORDER — CLINDAMYCIN HCL 300 MG PO CAPS
ORAL_CAPSULE | ORAL | 0 refills | Status: DC
  Filled 2021-09-18: qty 4, 2d supply, fill #0
  Filled 2021-10-10: qty 4, 1d supply, fill #0

## 2021-09-19 DIAGNOSIS — R2689 Other abnormalities of gait and mobility: Secondary | ICD-10-CM | POA: Diagnosis not present

## 2021-09-19 DIAGNOSIS — S7292XS Unspecified fracture of left femur, sequela: Secondary | ICD-10-CM | POA: Diagnosis not present

## 2021-09-26 DIAGNOSIS — R2689 Other abnormalities of gait and mobility: Secondary | ICD-10-CM | POA: Diagnosis not present

## 2021-09-26 DIAGNOSIS — S7292XS Unspecified fracture of left femur, sequela: Secondary | ICD-10-CM | POA: Diagnosis not present

## 2021-10-01 ENCOUNTER — Other Ambulatory Visit: Payer: Self-pay

## 2021-10-03 DIAGNOSIS — R2689 Other abnormalities of gait and mobility: Secondary | ICD-10-CM | POA: Diagnosis not present

## 2021-10-03 DIAGNOSIS — S7292XS Unspecified fracture of left femur, sequela: Secondary | ICD-10-CM | POA: Diagnosis not present

## 2021-10-09 DIAGNOSIS — S72352D Displaced comminuted fracture of shaft of left femur, subsequent encounter for closed fracture with routine healing: Secondary | ICD-10-CM | POA: Diagnosis not present

## 2021-10-09 DIAGNOSIS — M9701XA Periprosthetic fracture around internal prosthetic right hip joint, initial encounter: Secondary | ICD-10-CM | POA: Diagnosis not present

## 2021-10-09 DIAGNOSIS — Z96642 Presence of left artificial hip joint: Secondary | ICD-10-CM | POA: Diagnosis not present

## 2021-10-09 DIAGNOSIS — M9702XD Periprosthetic fracture around internal prosthetic left hip joint, subsequent encounter: Secondary | ICD-10-CM | POA: Diagnosis not present

## 2021-10-09 DIAGNOSIS — Z967 Presence of other bone and tendon implants: Secondary | ICD-10-CM | POA: Diagnosis not present

## 2021-10-09 DIAGNOSIS — S72302D Unspecified fracture of shaft of left femur, subsequent encounter for closed fracture with routine healing: Secondary | ICD-10-CM | POA: Diagnosis not present

## 2021-10-09 DIAGNOSIS — Z880 Allergy status to penicillin: Secondary | ICD-10-CM | POA: Diagnosis not present

## 2021-10-10 ENCOUNTER — Other Ambulatory Visit: Payer: Self-pay

## 2021-10-10 DIAGNOSIS — R2689 Other abnormalities of gait and mobility: Secondary | ICD-10-CM | POA: Diagnosis not present

## 2021-10-10 DIAGNOSIS — S7292XS Unspecified fracture of left femur, sequela: Secondary | ICD-10-CM | POA: Diagnosis not present

## 2021-10-16 DIAGNOSIS — S7292XS Unspecified fracture of left femur, sequela: Secondary | ICD-10-CM | POA: Diagnosis not present

## 2021-10-16 DIAGNOSIS — R2689 Other abnormalities of gait and mobility: Secondary | ICD-10-CM | POA: Diagnosis not present

## 2021-10-23 DIAGNOSIS — S7292XS Unspecified fracture of left femur, sequela: Secondary | ICD-10-CM | POA: Diagnosis not present

## 2021-10-23 DIAGNOSIS — R2689 Other abnormalities of gait and mobility: Secondary | ICD-10-CM | POA: Diagnosis not present

## 2021-10-30 DIAGNOSIS — R2689 Other abnormalities of gait and mobility: Secondary | ICD-10-CM | POA: Diagnosis not present

## 2021-10-30 DIAGNOSIS — S7292XS Unspecified fracture of left femur, sequela: Secondary | ICD-10-CM | POA: Diagnosis not present

## 2021-11-06 ENCOUNTER — Other Ambulatory Visit: Payer: Self-pay

## 2021-11-06 DIAGNOSIS — S72352D Displaced comminuted fracture of shaft of left femur, subsequent encounter for closed fracture with routine healing: Secondary | ICD-10-CM | POA: Diagnosis not present

## 2021-11-06 DIAGNOSIS — M79605 Pain in left leg: Secondary | ICD-10-CM | POA: Diagnosis not present

## 2021-11-06 MED ORDER — MELOXICAM 15 MG PO TABS
ORAL_TABLET | ORAL | 0 refills | Status: DC
  Filled 2021-11-06: qty 30, 30d supply, fill #0

## 2021-11-13 DIAGNOSIS — S72352D Displaced comminuted fracture of shaft of left femur, subsequent encounter for closed fracture with routine healing: Secondary | ICD-10-CM | POA: Diagnosis not present

## 2021-11-13 DIAGNOSIS — M978XXD Periprosthetic fracture around other internal prosthetic joint, subsequent encounter: Secondary | ICD-10-CM | POA: Diagnosis not present

## 2021-11-13 DIAGNOSIS — Z9889 Other specified postprocedural states: Secondary | ICD-10-CM | POA: Diagnosis not present

## 2021-11-13 DIAGNOSIS — M898X5 Other specified disorders of bone, thigh: Secondary | ICD-10-CM | POA: Diagnosis not present

## 2021-11-13 DIAGNOSIS — Z96642 Presence of left artificial hip joint: Secondary | ICD-10-CM | POA: Diagnosis not present

## 2021-12-03 ENCOUNTER — Other Ambulatory Visit: Payer: Self-pay

## 2021-12-03 MED ORDER — SCOPOLAMINE 1 MG/3DAYS TD PT72
MEDICATED_PATCH | TRANSDERMAL | 0 refills | Status: DC
  Filled 2021-12-03: qty 10, 30d supply, fill #0

## 2021-12-04 DIAGNOSIS — H5704 Mydriasis: Secondary | ICD-10-CM | POA: Diagnosis not present

## 2021-12-04 DIAGNOSIS — S72302K Unspecified fracture of shaft of left femur, subsequent encounter for closed fracture with nonunion: Secondary | ICD-10-CM | POA: Diagnosis not present

## 2021-12-04 DIAGNOSIS — S72302D Unspecified fracture of shaft of left femur, subsequent encounter for closed fracture with routine healing: Secondary | ICD-10-CM | POA: Diagnosis not present

## 2021-12-04 DIAGNOSIS — Z9889 Other specified postprocedural states: Secondary | ICD-10-CM | POA: Diagnosis not present

## 2021-12-04 DIAGNOSIS — S72392K Other fracture of shaft of left femur, subsequent encounter for closed fracture with nonunion: Secondary | ICD-10-CM | POA: Diagnosis not present

## 2021-12-04 DIAGNOSIS — Z472 Encounter for removal of internal fixation device: Secondary | ICD-10-CM | POA: Diagnosis not present

## 2021-12-04 DIAGNOSIS — B9689 Other specified bacterial agents as the cause of diseases classified elsewhere: Secondary | ICD-10-CM | POA: Diagnosis not present

## 2021-12-04 DIAGNOSIS — S72352K Displaced comminuted fracture of shaft of left femur, subsequent encounter for closed fracture with nonunion: Secondary | ICD-10-CM | POA: Diagnosis not present

## 2021-12-06 DIAGNOSIS — S72302D Unspecified fracture of shaft of left femur, subsequent encounter for closed fracture with routine healing: Secondary | ICD-10-CM | POA: Diagnosis not present

## 2021-12-06 DIAGNOSIS — B9689 Other specified bacterial agents as the cause of diseases classified elsewhere: Secondary | ICD-10-CM | POA: Diagnosis not present

## 2021-12-10 NOTE — Patient Outreach (Signed)
Received a Hospital discharge notification for Ms. Pignato from [facility] . ?I have assigned Elliot Cousin, RN to call for follow up and determine if there are any Case Management needs.  ?  ?Iverson Alamin, CBCS, CMAA ?[facility] Care Management Assistant ?[facility] Care Management ?[phone]   ?

## 2021-12-11 ENCOUNTER — Other Ambulatory Visit: Payer: Self-pay | Admitting: *Deleted

## 2021-12-11 NOTE — Patient Outreach (Signed)
[facility] Customer service manager [facility]) Care Management ? ?12/11/2021 ? ?Lauren Weber ?11-Aug-1991 ?[id number] ? ? ?Transition of care call  ?Referral received: 12/10/2021 ?Initial outreach:12/11/2021 ?Insurance: [employer] Focus Plan/ U[employer] Health Plan ? ?Initial telephone call to patient, 2 HIPAA identifiers verified as documented. No ongoing care management needs identified, participate with Active Health Management with no additional needs. Pt inquired on her antibiotic however RN does not have access to medication prescribed via [facility] in Epic. RN encouraged pt to review her discharge papers and verified she has an understanding. Educated on the medication and strongly encouraged pt to complete the entire dosage as prescribed. Pt has supportive assistance from her mother and verified her follow up appointments is next Thursday with her provider. No other issues or needs to address at this time. ? ?RN offered further assist with another outreach call however declined. Provided pt with RN case manager contact number if further questions or needs arise. Case will be closed. ? ?Elliot Cousin, RN ?Care Management Coordinator ?[facility] Customer service manager ?Main Office [phone]  ?

## 2021-12-17 ENCOUNTER — Other Ambulatory Visit: Payer: Self-pay

## 2021-12-18 ENCOUNTER — Other Ambulatory Visit: Payer: Self-pay

## 2021-12-18 MED ORDER — OXYCODONE-ACETAMINOPHEN 5-325 MG PO TABS
ORAL_TABLET | ORAL | 0 refills | Status: DC
  Filled 2021-12-18: qty 28, 7d supply, fill #0

## 2021-12-21 DIAGNOSIS — R2689 Other abnormalities of gait and mobility: Secondary | ICD-10-CM | POA: Diagnosis not present

## 2021-12-21 DIAGNOSIS — M25562 Pain in left knee: Secondary | ICD-10-CM | POA: Diagnosis not present

## 2021-12-21 DIAGNOSIS — M25552 Pain in left hip: Secondary | ICD-10-CM | POA: Diagnosis not present

## 2021-12-24 DIAGNOSIS — M25562 Pain in left knee: Secondary | ICD-10-CM | POA: Diagnosis not present

## 2021-12-24 DIAGNOSIS — R2689 Other abnormalities of gait and mobility: Secondary | ICD-10-CM | POA: Diagnosis not present

## 2021-12-24 DIAGNOSIS — M25552 Pain in left hip: Secondary | ICD-10-CM | POA: Diagnosis not present

## 2021-12-25 DIAGNOSIS — S7292XK Unspecified fracture of left femur, subsequent encounter for closed fracture with nonunion: Secondary | ICD-10-CM | POA: Diagnosis not present

## 2021-12-26 ENCOUNTER — Other Ambulatory Visit: Payer: Self-pay

## 2021-12-26 MED ORDER — OXYCODONE-ACETAMINOPHEN 5-325 MG PO TABS
ORAL_TABLET | ORAL | 0 refills | Status: DC
  Filled 2021-12-26: qty 28, 7d supply, fill #0

## 2021-12-27 DIAGNOSIS — M25562 Pain in left knee: Secondary | ICD-10-CM | POA: Diagnosis not present

## 2021-12-27 DIAGNOSIS — M25552 Pain in left hip: Secondary | ICD-10-CM | POA: Diagnosis not present

## 2021-12-27 DIAGNOSIS — R2689 Other abnormalities of gait and mobility: Secondary | ICD-10-CM | POA: Diagnosis not present

## 2021-12-28 ENCOUNTER — Other Ambulatory Visit: Payer: Self-pay

## 2021-12-31 DIAGNOSIS — R2689 Other abnormalities of gait and mobility: Secondary | ICD-10-CM | POA: Diagnosis not present

## 2021-12-31 DIAGNOSIS — M25552 Pain in left hip: Secondary | ICD-10-CM | POA: Diagnosis not present

## 2021-12-31 DIAGNOSIS — M25562 Pain in left knee: Secondary | ICD-10-CM | POA: Diagnosis not present

## 2022-01-03 DIAGNOSIS — M25562 Pain in left knee: Secondary | ICD-10-CM | POA: Diagnosis not present

## 2022-01-03 DIAGNOSIS — M25552 Pain in left hip: Secondary | ICD-10-CM | POA: Diagnosis not present

## 2022-01-03 DIAGNOSIS — R2689 Other abnormalities of gait and mobility: Secondary | ICD-10-CM | POA: Diagnosis not present

## 2022-01-07 ENCOUNTER — Other Ambulatory Visit: Payer: Self-pay

## 2022-01-07 MED ORDER — OXYCODONE-ACETAMINOPHEN 5-325 MG PO TABS
ORAL_TABLET | ORAL | 0 refills | Status: DC
  Filled 2022-01-07: qty 28, 7d supply, fill #0

## 2022-01-08 DIAGNOSIS — R2689 Other abnormalities of gait and mobility: Secondary | ICD-10-CM | POA: Diagnosis not present

## 2022-01-08 DIAGNOSIS — M25562 Pain in left knee: Secondary | ICD-10-CM | POA: Diagnosis not present

## 2022-01-08 DIAGNOSIS — M25552 Pain in left hip: Secondary | ICD-10-CM | POA: Diagnosis not present

## 2022-01-10 DIAGNOSIS — R2689 Other abnormalities of gait and mobility: Secondary | ICD-10-CM | POA: Diagnosis not present

## 2022-01-10 DIAGNOSIS — M25552 Pain in left hip: Secondary | ICD-10-CM | POA: Diagnosis not present

## 2022-01-10 DIAGNOSIS — M25562 Pain in left knee: Secondary | ICD-10-CM | POA: Diagnosis not present

## 2022-01-15 ENCOUNTER — Other Ambulatory Visit: Payer: Self-pay

## 2022-01-15 DIAGNOSIS — R2689 Other abnormalities of gait and mobility: Secondary | ICD-10-CM | POA: Diagnosis not present

## 2022-01-15 DIAGNOSIS — M25562 Pain in left knee: Secondary | ICD-10-CM | POA: Diagnosis not present

## 2022-01-15 DIAGNOSIS — M25552 Pain in left hip: Secondary | ICD-10-CM | POA: Diagnosis not present

## 2022-01-15 MED ORDER — OXYCODONE-ACETAMINOPHEN 5-325 MG PO TABS
ORAL_TABLET | ORAL | 0 refills | Status: DC
  Filled 2022-01-15: qty 28, 7d supply, fill #0

## 2022-01-18 DIAGNOSIS — M25552 Pain in left hip: Secondary | ICD-10-CM | POA: Diagnosis not present

## 2022-01-18 DIAGNOSIS — R2689 Other abnormalities of gait and mobility: Secondary | ICD-10-CM | POA: Diagnosis not present

## 2022-01-18 DIAGNOSIS — M25562 Pain in left knee: Secondary | ICD-10-CM | POA: Diagnosis not present

## 2022-01-21 DIAGNOSIS — M25552 Pain in left hip: Secondary | ICD-10-CM | POA: Diagnosis not present

## 2022-01-21 DIAGNOSIS — R2689 Other abnormalities of gait and mobility: Secondary | ICD-10-CM | POA: Diagnosis not present

## 2022-01-21 DIAGNOSIS — M25562 Pain in left knee: Secondary | ICD-10-CM | POA: Diagnosis not present

## 2022-01-23 DIAGNOSIS — M25552 Pain in left hip: Secondary | ICD-10-CM | POA: Diagnosis not present

## 2022-01-23 DIAGNOSIS — R2689 Other abnormalities of gait and mobility: Secondary | ICD-10-CM | POA: Diagnosis not present

## 2022-01-23 DIAGNOSIS — M25562 Pain in left knee: Secondary | ICD-10-CM | POA: Diagnosis not present

## 2022-01-24 DIAGNOSIS — Z96642 Presence of left artificial hip joint: Secondary | ICD-10-CM | POA: Diagnosis not present

## 2022-01-24 DIAGNOSIS — S72352D Displaced comminuted fracture of shaft of left femur, subsequent encounter for closed fracture with routine healing: Secondary | ICD-10-CM | POA: Diagnosis not present

## 2022-01-29 DIAGNOSIS — M25552 Pain in left hip: Secondary | ICD-10-CM | POA: Diagnosis not present

## 2022-01-29 DIAGNOSIS — R2689 Other abnormalities of gait and mobility: Secondary | ICD-10-CM | POA: Diagnosis not present

## 2022-01-29 DIAGNOSIS — M25562 Pain in left knee: Secondary | ICD-10-CM | POA: Diagnosis not present

## 2022-02-01 DIAGNOSIS — M25562 Pain in left knee: Secondary | ICD-10-CM | POA: Diagnosis not present

## 2022-02-01 DIAGNOSIS — M25552 Pain in left hip: Secondary | ICD-10-CM | POA: Diagnosis not present

## 2022-02-01 DIAGNOSIS — R2689 Other abnormalities of gait and mobility: Secondary | ICD-10-CM | POA: Diagnosis not present

## 2022-02-05 DIAGNOSIS — R2689 Other abnormalities of gait and mobility: Secondary | ICD-10-CM | POA: Diagnosis not present

## 2022-02-05 DIAGNOSIS — M25552 Pain in left hip: Secondary | ICD-10-CM | POA: Diagnosis not present

## 2022-02-05 DIAGNOSIS — M25562 Pain in left knee: Secondary | ICD-10-CM | POA: Diagnosis not present

## 2022-02-07 DIAGNOSIS — M25552 Pain in left hip: Secondary | ICD-10-CM | POA: Diagnosis not present

## 2022-02-07 DIAGNOSIS — R2689 Other abnormalities of gait and mobility: Secondary | ICD-10-CM | POA: Diagnosis not present

## 2022-02-07 DIAGNOSIS — M25562 Pain in left knee: Secondary | ICD-10-CM | POA: Diagnosis not present

## 2022-02-08 DIAGNOSIS — M869 Osteomyelitis, unspecified: Secondary | ICD-10-CM | POA: Diagnosis not present

## 2022-02-11 DIAGNOSIS — M25552 Pain in left hip: Secondary | ICD-10-CM | POA: Diagnosis not present

## 2022-02-11 DIAGNOSIS — M25562 Pain in left knee: Secondary | ICD-10-CM | POA: Diagnosis not present

## 2022-02-11 DIAGNOSIS — R2689 Other abnormalities of gait and mobility: Secondary | ICD-10-CM | POA: Diagnosis not present

## 2022-02-14 DIAGNOSIS — M25552 Pain in left hip: Secondary | ICD-10-CM | POA: Diagnosis not present

## 2022-02-14 DIAGNOSIS — R2689 Other abnormalities of gait and mobility: Secondary | ICD-10-CM | POA: Diagnosis not present

## 2022-02-14 DIAGNOSIS — M25562 Pain in left knee: Secondary | ICD-10-CM | POA: Diagnosis not present

## 2022-02-20 DIAGNOSIS — R2689 Other abnormalities of gait and mobility: Secondary | ICD-10-CM | POA: Diagnosis not present

## 2022-02-20 DIAGNOSIS — M25562 Pain in left knee: Secondary | ICD-10-CM | POA: Diagnosis not present

## 2022-02-20 DIAGNOSIS — M25552 Pain in left hip: Secondary | ICD-10-CM | POA: Diagnosis not present

## 2022-02-22 DIAGNOSIS — M25562 Pain in left knee: Secondary | ICD-10-CM | POA: Diagnosis not present

## 2022-02-22 DIAGNOSIS — M25552 Pain in left hip: Secondary | ICD-10-CM | POA: Diagnosis not present

## 2022-02-22 DIAGNOSIS — R2689 Other abnormalities of gait and mobility: Secondary | ICD-10-CM | POA: Diagnosis not present

## 2022-02-25 DIAGNOSIS — R2689 Other abnormalities of gait and mobility: Secondary | ICD-10-CM | POA: Diagnosis not present

## 2022-02-25 DIAGNOSIS — M25552 Pain in left hip: Secondary | ICD-10-CM | POA: Diagnosis not present

## 2022-02-25 DIAGNOSIS — M25562 Pain in left knee: Secondary | ICD-10-CM | POA: Diagnosis not present

## 2022-03-01 DIAGNOSIS — M25552 Pain in left hip: Secondary | ICD-10-CM | POA: Diagnosis not present

## 2022-03-01 DIAGNOSIS — M25562 Pain in left knee: Secondary | ICD-10-CM | POA: Diagnosis not present

## 2022-03-01 DIAGNOSIS — R2689 Other abnormalities of gait and mobility: Secondary | ICD-10-CM | POA: Diagnosis not present

## 2022-03-04 DIAGNOSIS — M25552 Pain in left hip: Secondary | ICD-10-CM | POA: Diagnosis not present

## 2022-03-04 DIAGNOSIS — R2689 Other abnormalities of gait and mobility: Secondary | ICD-10-CM | POA: Diagnosis not present

## 2022-03-04 DIAGNOSIS — M25562 Pain in left knee: Secondary | ICD-10-CM | POA: Diagnosis not present

## 2022-03-08 DIAGNOSIS — M25562 Pain in left knee: Secondary | ICD-10-CM | POA: Diagnosis not present

## 2022-03-08 DIAGNOSIS — R2689 Other abnormalities of gait and mobility: Secondary | ICD-10-CM | POA: Diagnosis not present

## 2022-03-08 DIAGNOSIS — M25552 Pain in left hip: Secondary | ICD-10-CM | POA: Diagnosis not present

## 2022-03-11 DIAGNOSIS — M25562 Pain in left knee: Secondary | ICD-10-CM | POA: Diagnosis not present

## 2022-03-11 DIAGNOSIS — M25552 Pain in left hip: Secondary | ICD-10-CM | POA: Diagnosis not present

## 2022-03-11 DIAGNOSIS — R2689 Other abnormalities of gait and mobility: Secondary | ICD-10-CM | POA: Diagnosis not present

## 2022-03-14 DIAGNOSIS — Z967 Presence of other bone and tendon implants: Secondary | ICD-10-CM | POA: Diagnosis not present

## 2022-03-14 DIAGNOSIS — S72302D Unspecified fracture of shaft of left femur, subsequent encounter for closed fracture with routine healing: Secondary | ICD-10-CM | POA: Diagnosis not present

## 2022-03-14 DIAGNOSIS — S72392D Other fracture of shaft of left femur, subsequent encounter for closed fracture with routine healing: Secondary | ICD-10-CM | POA: Diagnosis not present

## 2022-03-21 DIAGNOSIS — M25552 Pain in left hip: Secondary | ICD-10-CM | POA: Diagnosis not present

## 2022-03-21 DIAGNOSIS — R2689 Other abnormalities of gait and mobility: Secondary | ICD-10-CM | POA: Diagnosis not present

## 2022-03-21 DIAGNOSIS — M25562 Pain in left knee: Secondary | ICD-10-CM | POA: Diagnosis not present

## 2022-03-26 DIAGNOSIS — M25562 Pain in left knee: Secondary | ICD-10-CM | POA: Diagnosis not present

## 2022-03-26 DIAGNOSIS — M25552 Pain in left hip: Secondary | ICD-10-CM | POA: Diagnosis not present

## 2022-03-26 DIAGNOSIS — R2689 Other abnormalities of gait and mobility: Secondary | ICD-10-CM | POA: Diagnosis not present

## 2022-03-28 ENCOUNTER — Telehealth: Payer: Self-pay

## 2022-03-28 NOTE — Telephone Encounter (Signed)
Copied from CRM [id number]. Topic: General - Inquiry >> Mar 28, 2022 12:48 PM De Blanch wrote: Reason for CRM: Pt stated she is going back to school and needs her Immunization records. Pt is requesting to pick them up.     Please advise.

## 2022-03-29 DIAGNOSIS — R2689 Other abnormalities of gait and mobility: Secondary | ICD-10-CM | POA: Diagnosis not present

## 2022-03-29 DIAGNOSIS — M25562 Pain in left knee: Secondary | ICD-10-CM | POA: Diagnosis not present

## 2022-03-29 DIAGNOSIS — M25552 Pain in left hip: Secondary | ICD-10-CM | POA: Diagnosis not present

## 2022-03-29 NOTE — Telephone Encounter (Signed)
Copy of immunization report printed and placed up front for pick up. Patient advised.

## 2022-04-01 DIAGNOSIS — R2689 Other abnormalities of gait and mobility: Secondary | ICD-10-CM | POA: Diagnosis not present

## 2022-04-01 DIAGNOSIS — M25562 Pain in left knee: Secondary | ICD-10-CM | POA: Diagnosis not present

## 2022-04-01 DIAGNOSIS — M25552 Pain in left hip: Secondary | ICD-10-CM | POA: Diagnosis not present

## 2022-04-04 DIAGNOSIS — R2689 Other abnormalities of gait and mobility: Secondary | ICD-10-CM | POA: Diagnosis not present

## 2022-04-04 DIAGNOSIS — M25552 Pain in left hip: Secondary | ICD-10-CM | POA: Diagnosis not present

## 2022-04-04 DIAGNOSIS — M25562 Pain in left knee: Secondary | ICD-10-CM | POA: Diagnosis not present

## 2022-04-08 DIAGNOSIS — R2689 Other abnormalities of gait and mobility: Secondary | ICD-10-CM | POA: Diagnosis not present

## 2022-04-08 DIAGNOSIS — M25562 Pain in left knee: Secondary | ICD-10-CM | POA: Diagnosis not present

## 2022-04-08 DIAGNOSIS — M25552 Pain in left hip: Secondary | ICD-10-CM | POA: Diagnosis not present

## 2022-04-10 DIAGNOSIS — R2689 Other abnormalities of gait and mobility: Secondary | ICD-10-CM | POA: Diagnosis not present

## 2022-04-10 DIAGNOSIS — M25562 Pain in left knee: Secondary | ICD-10-CM | POA: Diagnosis not present

## 2022-04-10 DIAGNOSIS — M25552 Pain in left hip: Secondary | ICD-10-CM | POA: Diagnosis not present

## 2022-04-15 DIAGNOSIS — R2689 Other abnormalities of gait and mobility: Secondary | ICD-10-CM | POA: Diagnosis not present

## 2022-04-15 DIAGNOSIS — M25562 Pain in left knee: Secondary | ICD-10-CM | POA: Diagnosis not present

## 2022-04-15 DIAGNOSIS — M25552 Pain in left hip: Secondary | ICD-10-CM | POA: Diagnosis not present

## 2022-04-26 DIAGNOSIS — R2689 Other abnormalities of gait and mobility: Secondary | ICD-10-CM | POA: Diagnosis not present

## 2022-04-26 DIAGNOSIS — M25562 Pain in left knee: Secondary | ICD-10-CM | POA: Diagnosis not present

## 2022-04-26 DIAGNOSIS — M25552 Pain in left hip: Secondary | ICD-10-CM | POA: Diagnosis not present

## 2022-04-29 DIAGNOSIS — M25552 Pain in left hip: Secondary | ICD-10-CM | POA: Diagnosis not present

## 2022-04-29 DIAGNOSIS — R2689 Other abnormalities of gait and mobility: Secondary | ICD-10-CM | POA: Diagnosis not present

## 2022-04-29 DIAGNOSIS — M25562 Pain in left knee: Secondary | ICD-10-CM | POA: Diagnosis not present

## 2022-05-10 DIAGNOSIS — M25552 Pain in left hip: Secondary | ICD-10-CM | POA: Diagnosis not present

## 2022-05-10 DIAGNOSIS — M25562 Pain in left knee: Secondary | ICD-10-CM | POA: Diagnosis not present

## 2022-05-10 DIAGNOSIS — R2689 Other abnormalities of gait and mobility: Secondary | ICD-10-CM | POA: Diagnosis not present

## 2022-05-13 DIAGNOSIS — R2689 Other abnormalities of gait and mobility: Secondary | ICD-10-CM | POA: Diagnosis not present

## 2022-05-13 DIAGNOSIS — M25552 Pain in left hip: Secondary | ICD-10-CM | POA: Diagnosis not present

## 2022-05-13 DIAGNOSIS — M25562 Pain in left knee: Secondary | ICD-10-CM | POA: Diagnosis not present

## 2022-05-24 DIAGNOSIS — M25552 Pain in left hip: Secondary | ICD-10-CM | POA: Diagnosis not present

## 2022-05-24 DIAGNOSIS — M25562 Pain in left knee: Secondary | ICD-10-CM | POA: Diagnosis not present

## 2022-05-24 DIAGNOSIS — R2689 Other abnormalities of gait and mobility: Secondary | ICD-10-CM | POA: Diagnosis not present

## 2022-05-31 DIAGNOSIS — M25562 Pain in left knee: Secondary | ICD-10-CM | POA: Diagnosis not present

## 2022-05-31 DIAGNOSIS — M25552 Pain in left hip: Secondary | ICD-10-CM | POA: Diagnosis not present

## 2022-05-31 DIAGNOSIS — R2689 Other abnormalities of gait and mobility: Secondary | ICD-10-CM | POA: Diagnosis not present

## 2022-06-06 DIAGNOSIS — Z760 Encounter for issue of repeat prescription: Secondary | ICD-10-CM | POA: Diagnosis not present

## 2022-06-07 DIAGNOSIS — M25552 Pain in left hip: Secondary | ICD-10-CM | POA: Diagnosis not present

## 2022-06-07 DIAGNOSIS — R2689 Other abnormalities of gait and mobility: Secondary | ICD-10-CM | POA: Diagnosis not present

## 2022-06-07 DIAGNOSIS — M25562 Pain in left knee: Secondary | ICD-10-CM | POA: Diagnosis not present

## 2022-06-13 ENCOUNTER — Ambulatory Visit: Payer: 59 | Admitting: Physician Assistant

## 2022-06-13 ENCOUNTER — Other Ambulatory Visit: Payer: Self-pay

## 2022-06-13 ENCOUNTER — Encounter: Payer: Self-pay | Admitting: Physician Assistant

## 2022-06-13 VITALS — BP 100/67 | HR 92 | Temp 98.0°F | Wt 167.0 lb

## 2022-06-13 DIAGNOSIS — J4520 Mild intermittent asthma, uncomplicated: Secondary | ICD-10-CM | POA: Diagnosis not present

## 2022-06-13 DIAGNOSIS — J069 Acute upper respiratory infection, unspecified: Secondary | ICD-10-CM | POA: Diagnosis not present

## 2022-06-13 LAB — POC COVID19 BINAXNOW: SARS Coronavirus 2 Ag: NEGATIVE

## 2022-06-13 LAB — POCT INFLUENZA A/B
Influenza A, POC: NEGATIVE
Influenza B, POC: NEGATIVE

## 2022-06-13 MED ORDER — BENZONATATE 100 MG PO CAPS
100.0000 mg | ORAL_CAPSULE | Freq: Two times a day (BID) | ORAL | 0 refills | Status: DC | PRN
Start: 2022-06-13 — End: 2022-08-05
  Filled 2022-06-13: qty 20, 10d supply, fill #0

## 2022-06-13 MED ORDER — ALBUTEROL SULFATE HFA 108 (90 BASE) MCG/ACT IN AERS
1.0000 | INHALATION_SPRAY | RESPIRATORY_TRACT | 3 refills | Status: AC | PRN
  Filled 2022-06-13: qty 6.7, 25d supply, fill #0

## 2022-06-13 NOTE — Progress Notes (Signed)
[place] P[place],acting as a scribe for Mikey Kirschner, PA-C.,have documented all relevant documentation on the behalf of Mikey Kirschner, PA-C,as directed by  Mikey Kirschner, PA-C while in the presence of Mikey Kirschner, PA-C.     Established patient visit   Patient: Lauren Weber   DOB: 07/16/1991   31 y.o. Female  MRN: [id number] Visit Date: 06/13/2022  Today's healthcare provider: Mikey Kirschner, PA-C   Cc. Cough, cold symptoms.  Subjective    HPI  Patient is a 31 year old female who presents today for evaluation of cough and sinus drainage.  Patient began having symptoms of change in her voice on Friday 06/07/22.  When she woke the next day she had chest congestion and then yesterday she began having chills, fatigue, sore throat, cough and headaches.  She denies shortness of breath during the day but states when she first wakes and at night she feels a little short of breath.  She has more congestion in the mornings as well.   She states she was with her 68 year old nephew over the weekend and went to the fair.  He started having cough and congestion 2 days ago.  He is at this time being seen by his pediatrician.   She has been taking mucinex and melatonin OTC. She has a history of asthma and has lost her inhaler.   Medications: Outpatient Medications Prior to Visit  Medication Sig   acetaminophen (TYLENOL) 650 MG CR tablet Take 650 mg by mouth every 8 (eight) hours as needed for pain.   [DISCONTINUED] albuterol (VENTOLIN HFA) 108 (90 Base) MCG/ACT inhaler INHALE 2 PUFFS EVERY SIX HOURS AS NEEDED FOR WHEEZING.   buPROPion (WELLBUTRIN) 75 MG tablet Take 1 tablet (75 mg total) by mouth 2 (two) times daily. (Patient not taking: Reported on 06/13/2022)   enoxaparin (LOVENOX) 30 MG/0.3ML injection Inject 0.3 mLs (30 mg total) into the skin every 12 hours for 14 days. (Patient not taking: Reported on 06/13/2022)   Norethindrone Acetate-Ethinyl Estradiol (JUNEL 1.5/30) 1.5-30 MG-MCG  tablet Take 1 tablet by mouth daily. (Patient not taking: Reported on 06/13/2022)   oxyCODONE (OXY IR/ROXICODONE) 5 MG immediate release tablet Take 1 tablet by mouth twice a day as needed for pain / may supplement with OTC Tylenol / advil (Patient not taking: Reported on 06/13/2022)   oxyCODONE-acetaminophen (PERCOCET/ROXICET) 5-325 MG tablet Take 1 tablet by mouth every 6 (six) hours as needed for up to 7 days. (Patient not taking: Reported on 06/13/2022)   oxyCODONE-acetaminophen (PERCOCET/ROXICET) 5-325 MG tablet Take 1 tablet by mouth every 6 (six) hours as needed for up to 7 days. (Patient not taking: Reported on 06/13/2022)   pantoprazole (PROTONIX) 40 MG tablet Take 1 tablet (40 mg total) by mouth 2 (two) times daily before a meal. (Patient not taking: Reported on 06/13/2022)   No facility-administered medications prior to visit.    Review of Systems  Constitutional:  Positive for fatigue. Negative for fever.  HENT:  Positive for congestion and rhinorrhea.   Respiratory:  Positive for cough. Negative for shortness of breath.   Cardiovascular:  Negative for chest pain and leg swelling.  Gastrointestinal:  Negative for abdominal pain.  Neurological:  Negative for dizziness and headaches.       Objective    BP 100/67 (BP Location: Right Arm, Patient Position: Sitting, Cuff Size: Normal)   Pulse 92   Temp 98 F (36.7 C) (Oral)   Wt 167 lb (75.8 kg)   SpO2 98%  BMI 28.67 kg/m    Physical Exam Constitutional:      General: She is awake.     Appearance: She is well-developed.  HENT:     Head: Normocephalic.  Eyes:     Conjunctiva/sclera: Conjunctivae normal.  Cardiovascular:     Rate and Rhythm: Normal rate.  Pulmonary:     Effort: Pulmonary effort is normal.     Breath sounds: Normal breath sounds.  Skin:    General: Skin is warm.  Neurological:     Mental Status: She is alert and oriented to person, place, and time.  Psychiatric:        Attention and Perception:  Attention normal.        Mood and Affect: Mood normal.        Speech: Speech normal.        Behavior: Behavior is cooperative.      No results found for any visits on 06/13/22.  Assessment & Plan     URI Poc flu and covid negative Advised rest, fluids, mucinex, rx tessalon for cough can take robitussion otc   2. Asthma intermittent uncomplicated Refilled albuterol inhaler for prn use  Return in about 3 months (around 09/13/2022) for CPE.      I, Mikey Kirschner, PA-C have reviewed all documentation for this visit. The documentation on  06/13/2022  for the exam, diagnosis, procedures, and orders are all accurate and complete.  Mikey Kirschner, PA-C [facility] [address] [place], [place], [address] Office: [phone] Fax: [place]

## 2022-06-13 NOTE — Assessment & Plan Note (Signed)
Pt only uses albuterol inhaler when sick. Refilled today.

## 2022-06-21 DIAGNOSIS — R2689 Other abnormalities of gait and mobility: Secondary | ICD-10-CM | POA: Diagnosis not present

## 2022-06-21 DIAGNOSIS — M25552 Pain in left hip: Secondary | ICD-10-CM | POA: Diagnosis not present

## 2022-06-21 DIAGNOSIS — M25562 Pain in left knee: Secondary | ICD-10-CM | POA: Diagnosis not present

## 2022-06-28 DIAGNOSIS — R2689 Other abnormalities of gait and mobility: Secondary | ICD-10-CM | POA: Diagnosis not present

## 2022-06-28 DIAGNOSIS — M25552 Pain in left hip: Secondary | ICD-10-CM | POA: Diagnosis not present

## 2022-06-28 DIAGNOSIS — M25562 Pain in left knee: Secondary | ICD-10-CM | POA: Diagnosis not present

## 2022-07-02 DIAGNOSIS — M25552 Pain in left hip: Secondary | ICD-10-CM | POA: Diagnosis not present

## 2022-07-02 DIAGNOSIS — S72302D Unspecified fracture of shaft of left femur, subsequent encounter for closed fracture with routine healing: Secondary | ICD-10-CM | POA: Diagnosis not present

## 2022-07-02 DIAGNOSIS — Z96642 Presence of left artificial hip joint: Secondary | ICD-10-CM | POA: Diagnosis not present

## 2022-07-02 DIAGNOSIS — M25562 Pain in left knee: Secondary | ICD-10-CM | POA: Diagnosis not present

## 2022-07-02 DIAGNOSIS — R2689 Other abnormalities of gait and mobility: Secondary | ICD-10-CM | POA: Diagnosis not present

## 2022-07-02 DIAGNOSIS — Z9889 Other specified postprocedural states: Secondary | ICD-10-CM | POA: Diagnosis not present

## 2022-07-12 DIAGNOSIS — R2689 Other abnormalities of gait and mobility: Secondary | ICD-10-CM | POA: Diagnosis not present

## 2022-07-12 DIAGNOSIS — M25552 Pain in left hip: Secondary | ICD-10-CM | POA: Diagnosis not present

## 2022-07-12 DIAGNOSIS — M25562 Pain in left knee: Secondary | ICD-10-CM | POA: Diagnosis not present

## 2022-08-02 DIAGNOSIS — M25562 Pain in left knee: Secondary | ICD-10-CM | POA: Diagnosis not present

## 2022-08-02 DIAGNOSIS — M25552 Pain in left hip: Secondary | ICD-10-CM | POA: Diagnosis not present

## 2022-08-02 DIAGNOSIS — R2689 Other abnormalities of gait and mobility: Secondary | ICD-10-CM | POA: Diagnosis not present
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
## 2022-08-05 ENCOUNTER — Ambulatory Visit (INDEPENDENT_AMBULATORY_CARE_PROVIDER_SITE_OTHER): Payer: 59 | Admitting: Physician Assistant

## 2022-08-05 ENCOUNTER — Other Ambulatory Visit (HOSPITAL_COMMUNITY)
Admission: RE | Admit: 2022-08-05 | Discharge: 2022-08-05 | Disposition: A | Discharge status: Home / Self Care | Payer: 59 | Source: Ambulatory Visit | Attending: Physician Assistant | Admitting: Physician Assistant

## 2022-08-05 ENCOUNTER — Other Ambulatory Visit: Payer: Self-pay

## 2022-08-05 ENCOUNTER — Encounter: Payer: Self-pay | Admitting: Physician Assistant

## 2022-08-05 VITALS — BP 100/66 | HR 72 | Temp 98.5°F | Wt 167.0 lb

## 2022-08-05 DIAGNOSIS — Z124 Encounter for screening for malignant neoplasm of cervix: Secondary | ICD-10-CM | POA: Diagnosis not present

## 2022-08-05 DIAGNOSIS — Z3009 Encounter for other general counseling and advice on contraception: Secondary | ICD-10-CM | POA: Diagnosis not present

## 2022-08-05 DIAGNOSIS — Z0000 Encounter for general adult medical examination without abnormal findings: Secondary | ICD-10-CM

## 2022-08-05 MED ORDER — NORETHINDRONE ACET-ETHINYL EST 1.5-30 MG-MCG PO TABS
1.0000 | ORAL_TABLET | Freq: Every day | ORAL | 1 refills | Status: AC
  Filled 2022-08-05: qty 84, 84d supply, fill #0

## 2022-08-05 NOTE — Assessment & Plan Note (Signed)
Discussed multiple different options -ocp , nexplanon, IUD Pt would prefer to restart OCP advised Sunday start when she gets her next period Gave DVT precautions, SE of breakthrough bleeding

## 2022-08-05 NOTE — Addendum Note (Signed)
Addended by: Consuello Closs on: 08/05/2022 05:19 PM   Modules accepted: Orders

## 2022-08-05 NOTE — Assessment & Plan Note (Signed)
Pt unable to tolerate pelvic exam to complete pap smear. Advised once she is sexually active to return to office. Option for when she turns 32 do to HPV testing alone w/ vaginal swab.

## 2022-08-09 DIAGNOSIS — M25552 Pain in left hip: Secondary | ICD-10-CM | POA: Diagnosis not present

## 2022-08-09 DIAGNOSIS — M25562 Pain in left knee: Secondary | ICD-10-CM | POA: Diagnosis not present

## 2022-08-09 DIAGNOSIS — R2689 Other abnormalities of gait and mobility: Secondary | ICD-10-CM | POA: Diagnosis not present

## 2022-08-09 LAB — CYTOLOGY - PAP
Adequacy: ABNORMAL
Chlamydia: NEGATIVE
Comment: NEGATIVE
Comment: NEGATIVE
Comment: NEGATIVE
Comment: NORMAL
HSV1: NEGATIVE
HSV2: NEGATIVE
Neisseria Gonorrhea: NEGATIVE
Trichomonas: NEGATIVE

## 2022-08-13 ENCOUNTER — Other Ambulatory Visit: Payer: Self-pay

## 2022-08-16 DIAGNOSIS — M25552 Pain in left hip: Secondary | ICD-10-CM | POA: Diagnosis not present

## 2022-08-16 DIAGNOSIS — R2689 Other abnormalities of gait and mobility: Secondary | ICD-10-CM | POA: Diagnosis not present

## 2022-08-16 DIAGNOSIS — M25562 Pain in left knee: Secondary | ICD-10-CM | POA: Diagnosis not present

## 2022-08-23 DIAGNOSIS — M25562 Pain in left knee: Secondary | ICD-10-CM | POA: Diagnosis not present

## 2022-08-23 DIAGNOSIS — R2689 Other abnormalities of gait and mobility: Secondary | ICD-10-CM | POA: Diagnosis not present

## 2022-08-23 DIAGNOSIS — M25552 Pain in left hip: Secondary | ICD-10-CM | POA: Diagnosis not present

## 2022-08-30 DIAGNOSIS — M25552 Pain in left hip: Secondary | ICD-10-CM | POA: Diagnosis not present

## 2022-08-30 DIAGNOSIS — M25562 Pain in left knee: Secondary | ICD-10-CM | POA: Diagnosis not present

## 2022-08-30 DIAGNOSIS — R2689 Other abnormalities of gait and mobility: Secondary | ICD-10-CM | POA: Diagnosis not present

## 2022-09-06 DIAGNOSIS — R2689 Other abnormalities of gait and mobility: Secondary | ICD-10-CM | POA: Diagnosis not present

## 2022-09-06 DIAGNOSIS — M25552 Pain in left hip: Secondary | ICD-10-CM | POA: Diagnosis not present

## 2022-09-06 DIAGNOSIS — M25562 Pain in left knee: Secondary | ICD-10-CM | POA: Diagnosis not present

## 2022-10-03 ENCOUNTER — Other Ambulatory Visit: Payer: Self-pay

## 2022-10-03 DIAGNOSIS — M8580 Other specified disorders of bone density and structure, unspecified site: Secondary | ICD-10-CM | POA: Diagnosis not present

## 2022-10-03 DIAGNOSIS — Z9889 Other specified postprocedural states: Secondary | ICD-10-CM | POA: Diagnosis not present

## 2022-10-03 DIAGNOSIS — G8929 Other chronic pain: Secondary | ICD-10-CM | POA: Diagnosis not present

## 2022-10-03 DIAGNOSIS — M25562 Pain in left knee: Secondary | ICD-10-CM | POA: Diagnosis not present

## 2022-10-03 MED ORDER — MELOXICAM 15 MG PO TABS
15.0000 mg | ORAL_TABLET | Freq: Every day | ORAL | 2 refills | Status: AC
  Filled 2022-10-03: qty 30, 30d supply, fill #0
  Filled 2022-11-06: qty 30, 30d supply, fill #1

## 2022-10-21 ENCOUNTER — Telehealth: Payer: Self-pay

## 2022-10-21 NOTE — Telephone Encounter (Signed)
Copied from [place] [id number]. Topic: General - Other >> Oct 18, 2022 12:50 PM Ja-Kwan M wrote: Reason for CRM: Pt requests a copy of her immunization records. Cb#  [phone]

## 2022-10-25 ENCOUNTER — Telehealth: Payer: Self-pay | Admitting: Physician Assistant

## 2022-10-25 NOTE — Telephone Encounter (Signed)
Sending college form back for completion.  College would not take just a copy of her immunizations.    Call patient when completed [phone]

## 2022-10-28 ENCOUNTER — Encounter: Payer: Self-pay | Admitting: Physician Assistant

## 2022-10-28 ENCOUNTER — Other Ambulatory Visit: Payer: Self-pay | Admitting: Physician Assistant

## 2022-10-28 DIAGNOSIS — Z0184 Encounter for antibody response examination: Secondary | ICD-10-CM

## 2022-10-28 DIAGNOSIS — Z111 Encounter for screening for respiratory tuberculosis: Secondary | ICD-10-CM

## 2022-10-28 NOTE — Telephone Encounter (Signed)
MyChart message sent to patient by Mikey Kirschner advising appointment needed

## 2022-11-15 ENCOUNTER — Other Ambulatory Visit: Payer: Self-pay

## 2022-11-19 ENCOUNTER — Ambulatory Visit: Payer: BC Managed Care – PPO | Admitting: Physician Assistant

## 2022-11-19 ENCOUNTER — Encounter: Payer: Self-pay | Admitting: Physician Assistant

## 2022-11-19 VITALS — BP 109/67 | HR 66 | Wt 164.0 lb

## 2022-11-19 DIAGNOSIS — Z111 Encounter for screening for respiratory tuberculosis: Secondary | ICD-10-CM | POA: Diagnosis not present

## 2022-11-19 DIAGNOSIS — Z23 Encounter for immunization: Secondary | ICD-10-CM

## 2022-11-19 DIAGNOSIS — Z0184 Encounter for antibody response examination: Secondary | ICD-10-CM | POA: Diagnosis not present

## 2022-11-19 NOTE — Progress Notes (Signed)
      Established patient visit   Patient: Lauren Weber   DOB: 12/14/1990   32 y.o. Female  MRN: [id number] Visit Date: 11/19/2022  Today's healthcare provider: Mikey Kirschner, PA-C   Cc. School paperwork.   Subjective    HPI  Patient is being seen to discuss paperwork for school.  Medications: Outpatient Medications Prior to Visit  Medication Sig   acetaminophen (TYLENOL) 650 MG CR tablet Take 650 mg by mouth every 8 (eight) hours as needed for pain.   albuterol (VENTOLIN HFA) 108 (90 Base) MCG/ACT inhaler Inhale 1-2 puffs into the lungs every 4 (four) hours as needed for wheezing or shortness of breath.   meloxicam (MOBIC) 15 MG tablet Take 1 tablet (15 mg total) by mouth daily.   Norethindrone Acetate-Ethinyl Estradiol (JUNEL 1.5/30) 1.5-30 MG-MCG tablet Take 1 tablet by mouth daily.   No facility-administered medications prior to visit.    Review of Systems  Constitutional:  Negative for fever.  Respiratory:  Negative for cough and shortness of breath.       Objective    BP 109/67 (BP Location: Left Arm, Patient Position: Sitting, Cuff Size: Normal)   Pulse 66   Wt 164 lb (74.4 kg)   SpO2 97%   BMI 28.15 kg/m    Physical Exam Vitals reviewed.  Constitutional:      Appearance: She is not ill-appearing.  HENT:     Head: Normocephalic.  Eyes:     Conjunctiva/sclera: Conjunctivae normal.  Cardiovascular:     Rate and Rhythm: Normal rate.  Pulmonary:     Effort: Pulmonary effort is normal. No respiratory distress.  Neurological:     General: No focal deficit present.     Mental Status: She is alert and oriented to person, place, and time.  Psychiatric:        Mood and Affect: Mood normal.        Behavior: Behavior normal.     No results found for any visits on 11/19/22.  Assessment & Plan     Updating vaccines Pt in need of Tdap booster, consented and given. Per pt school requires covid booster, consented and given.  Advised no varicella  vaccine history, needs to repeat titers.  TB screening ordered, quantiferon gold.   Discussed the optional Men B vaccine. Pt declines.  Cleared for school, will finish paperwork after labs resulted  Problem List Items Addressed This Visit   None Visit Diagnoses     Screening-pulmonary TB    -  Primary   Relevant Orders   QuantiFERON-TB Gold Plus   Immunity to varicella determined by serologic test       Relevant Orders   Varicella zoster antibody, IgG   Need for Tdap vaccination       Relevant Orders   Tdap vaccine greater than or equal to 7yo IM (Completed)   Encounter for immunization       Relevant Orders   Pfizer Fall 2023 Covid-19 Vaccine 63yrs and older (Completed)       Return if symptoms worsen or fail to improve.      I, Mikey Kirschner, PA-C have reviewed all documentation for this visit. The documentation on  11/19/22  for the exam, diagnosis, procedures, and orders are all accurate and complete.  Mikey Kirschner, PA-C [facility] [address] [place], [place], [address] Office: [phone] Fax: [place]

## 2022-11-27 ENCOUNTER — Telehealth: Payer: Self-pay | Admitting: Physician Assistant

## 2022-11-27 LAB — QUANTIFERON-TB GOLD PLUS
QuantiFERON Mitogen Value: >10 IU/mL
QuantiFERON Nil Value: 0.02 IU/mL
QuantiFERON TB1 Ag Value: 0.05 IU/mL
QuantiFERON TB2 Ag Value: 0.03 IU/mL
QuantiFERON-TB Gold Plus: NEGATIVE

## 2022-11-27 LAB — VARICELLA ZOSTER ANTIBODY, IGG: Varicella zoster IgG: 1943 index (ref >165)

## 2022-11-27 NOTE — Telephone Encounter (Signed)
Patient called for results and notified:  Everything looks good. Forms are ready for you at the front desk

## 2022-11-27 NOTE — Telephone Encounter (Signed)
Patient returned called about tb screen results. She say she is in class if she can't answer and its ok ot leave a voicemail

## 2022-12-03 DIAGNOSIS — S72302D Unspecified fracture of shaft of left femur, subsequent encounter for closed fracture with routine healing: Secondary | ICD-10-CM | POA: Diagnosis not present

## 2022-12-03 DIAGNOSIS — M25562 Pain in left knee: Secondary | ICD-10-CM | POA: Diagnosis not present

## 2022-12-03 DIAGNOSIS — G8929 Other chronic pain: Secondary | ICD-10-CM | POA: Diagnosis not present

## 2022-12-13 DIAGNOSIS — W540XXA Bitten by dog, initial encounter: Secondary | ICD-10-CM | POA: Diagnosis not present

## 2022-12-13 DIAGNOSIS — S50312A Abrasion of left elbow, initial encounter: Secondary | ICD-10-CM | POA: Diagnosis not present

## 2022-12-13 DIAGNOSIS — M7989 Other specified soft tissue disorders: Secondary | ICD-10-CM | POA: Diagnosis not present

## 2022-12-13 DIAGNOSIS — Z880 Allergy status to penicillin: Secondary | ICD-10-CM | POA: Diagnosis not present

## 2022-12-13 DIAGNOSIS — S50311A Abrasion of right elbow, initial encounter: Secondary | ICD-10-CM | POA: Diagnosis not present

## 2022-12-13 DIAGNOSIS — S80211A Abrasion, right knee, initial encounter: Secondary | ICD-10-CM | POA: Diagnosis not present

## 2022-12-13 DIAGNOSIS — S80212A Abrasion, left knee, initial encounter: Secondary | ICD-10-CM | POA: Diagnosis not present
